# Patient Record
Sex: Female | Born: 1955 | Race: Black or African American | Hispanic: No | Marital: Married | Smoking: Never smoker
Health system: Southern US, Community
[De-identification: ages and names within clinical notes are randomized; demographics above are authoritative.]

## PROBLEM LIST (undated history)

## (undated) DIAGNOSIS — I10 Essential (primary) hypertension: Secondary | ICD-10-CM

---

## 2006-12-19 ENCOUNTER — Emergency Department: Payer: Self-pay | Admitting: Emergency Medicine

## 2011-04-23 ENCOUNTER — Emergency Department: Payer: Self-pay | Admitting: Emergency Medicine

## 2012-01-16 ENCOUNTER — Emergency Department: Payer: Self-pay | Admitting: Emergency Medicine

## 2012-05-15 ENCOUNTER — Emergency Department: Payer: Self-pay | Admitting: Emergency Medicine

## 2012-05-15 LAB — COMPREHENSIVE METABOLIC PANEL
Anion Gap: 3 — ABNORMAL LOW (ref 7–16)
Calcium, Total: 10.9 mg/dL — ABNORMAL HIGH (ref 8.5–10.1)
Chloride: 109 mmol/L — ABNORMAL HIGH (ref 98–107)
Co2: 29 mmol/L (ref 21–32)
Creatinine: 0.72 mg/dL (ref 0.60–1.30)
EGFR (African American): >60
Osmolality: 283 (ref 275–301)
Potassium: 5.1 mmol/L (ref 3.5–5.1)
SGOT(AST): 32 U/L (ref 15–37)
Sodium: 141 mmol/L (ref 136–145)
Total Protein: 8.2 g/dL (ref 6.4–8.2)

## 2012-05-15 LAB — URINALYSIS, COMPLETE
Bilirubin,UR: NEGATIVE
Glucose,UR: NEGATIVE mg/dL (ref 0–75)
Nitrite: NEGATIVE
Ph: 6 (ref 4.5–8.0)
RBC,UR: 2 /HPF (ref 0–5)
Squamous Epithelial: 7
WBC UR: 4 /HPF (ref 0–5)

## 2012-05-15 LAB — CBC
HCT: 46.3 % (ref 35.0–47.0)
HGB: 15.7 g/dL (ref 12.0–16.0)
MCHC: 33.8 g/dL (ref 32.0–36.0)
MCV: 92 fL (ref 80–100)
Platelet: 203 10*3/uL (ref 150–440)
RDW: 13.3 % (ref 11.5–14.5)

## 2012-11-03 ENCOUNTER — Emergency Department: Payer: Self-pay | Admitting: Emergency Medicine

## 2012-11-03 LAB — CBC WITH DIFFERENTIAL/PLATELET
Basophil #: 0 10*3/uL (ref 0.0–0.1)
Basophil %: 0.6 %
Eosinophil #: 0.1 10*3/uL (ref 0.0–0.7)
Eosinophil %: 0.9 %
HCT: 42 % (ref 35.0–47.0)
HGB: 14.3 g/dL (ref 12.0–16.0)
Lymphocyte #: 1.6 10*3/uL (ref 1.0–3.6)
Lymphocyte %: 22 %
MCH: 30.9 pg (ref 26.0–34.0)
MCV: 91 fL (ref 80–100)
Monocyte %: 12.5 %
Neutrophil #: 4.8 10*3/uL (ref 1.4–6.5)
Neutrophil %: 64 %
RDW: 13.9 % (ref 11.5–14.5)
WBC: 7.5 10*3/uL (ref 3.6–11.0)

## 2012-11-03 LAB — BASIC METABOLIC PANEL
Anion Gap: 4 — ABNORMAL LOW (ref 7–16)
BUN: 10 mg/dL (ref 7–18)
Calcium, Total: 8.7 mg/dL (ref 8.5–10.1)
Co2: 26 mmol/L (ref 21–32)
EGFR (African American): >60
EGFR (Non-African Amer.): >60
Glucose: 88 mg/dL (ref 65–99)
Osmolality: 274 (ref 275–301)
Sodium: 138 mmol/L (ref 136–145)

## 2013-11-05 ENCOUNTER — Emergency Department: Payer: Self-pay | Admitting: Emergency Medicine

## 2013-11-05 LAB — PRO B NATRIURETIC PEPTIDE: B-Type Natriuretic Peptide: 25 pg/mL (ref 0–125)

## 2013-11-05 LAB — CBC
HCT: 45.9 % (ref 35.0–47.0)
HGB: 15.2 g/dL (ref 12.0–16.0)
MCH: 30.7 pg (ref 26.0–34.0)
MCHC: 33.1 g/dL (ref 32.0–36.0)
MCV: 93 fL (ref 80–100)
PLATELETS: 213 10*3/uL (ref 150–440)
RBC: 4.95 10*6/uL (ref 3.80–5.20)
RDW: 13.4 % (ref 11.5–14.5)
WBC: 7.4 10*3/uL (ref 3.6–11.0)

## 2013-11-05 LAB — COMPREHENSIVE METABOLIC PANEL
ALBUMIN: 3.6 g/dL (ref 3.4–5.0)
ANION GAP: 4 — AB (ref 7–16)
AST: 25 U/L (ref 15–37)
Alkaline Phosphatase: 65 U/L
BUN: 17 mg/dL (ref 7–18)
Bilirubin,Total: 0.4 mg/dL (ref 0.2–1.0)
CHLORIDE: 107 mmol/L (ref 98–107)
Calcium, Total: 8.7 mg/dL (ref 8.5–10.1)
Co2: 28 mmol/L (ref 21–32)
Creatinine: 0.97 mg/dL (ref 0.60–1.30)
EGFR (African American): >60
Glucose: 103 mg/dL — ABNORMAL HIGH (ref 65–99)
Osmolality: 279 (ref 275–301)
Potassium: 4 mmol/L (ref 3.5–5.1)
SGPT (ALT): 18 U/L (ref 12–78)
Sodium: 139 mmol/L (ref 136–145)
Total Protein: 7.9 g/dL (ref 6.4–8.2)

## 2013-11-05 LAB — URINALYSIS, COMPLETE
Bacteria: NONE SEEN
Bilirubin,UR: NEGATIVE
Blood: NEGATIVE
GLUCOSE, UR: NEGATIVE mg/dL (ref 0–75)
KETONE: NEGATIVE
Leukocyte Esterase: NEGATIVE
NITRITE: NEGATIVE
PH: 5 (ref 4.5–8.0)
Protein: NEGATIVE
RBC, UR: NONE SEEN /HPF (ref 0–5)
Specific Gravity: 1.017 (ref 1.003–1.030)
WBC UR: <1 /HPF (ref 0–5)

## 2013-11-05 LAB — TROPONIN I

## 2014-07-15 ENCOUNTER — Emergency Department: Payer: Self-pay | Admitting: Emergency Medicine

## 2015-04-21 ENCOUNTER — Encounter: Payer: Self-pay | Admitting: *Deleted

## 2015-04-21 ENCOUNTER — Emergency Department
Admission: EM | Admit: 2015-04-21 | Discharge: 2015-04-21 | Disposition: A | Discharge status: Home / Self Care | Payer: TRICARE For Life (TFL) | Attending: Emergency Medicine | Admitting: Emergency Medicine

## 2015-04-21 DIAGNOSIS — R001 Bradycardia, unspecified: Secondary | ICD-10-CM | POA: Diagnosis not present

## 2015-04-21 DIAGNOSIS — I10 Essential (primary) hypertension: Secondary | ICD-10-CM | POA: Diagnosis present

## 2015-04-21 LAB — CBC WITH DIFFERENTIAL/PLATELET
Basophils Absolute: 0.1 10*3/uL (ref 0–0.1)
Basophils Relative: 2 %
EOS ABS: 0.1 10*3/uL (ref 0–0.7)
EOS PCT: 2 %
HCT: 44.3 % (ref 35.0–47.0)
Hemoglobin: 14.5 g/dL (ref 12.0–16.0)
LYMPHS ABS: 1.6 10*3/uL (ref 1.0–3.6)
Lymphocytes Relative: 32 %
MCH: 29.7 pg (ref 26.0–34.0)
MCHC: 32.7 g/dL (ref 32.0–36.0)
MCV: 90.7 fL (ref 80.0–100.0)
MONO ABS: 0.7 10*3/uL (ref 0.2–0.9)
MONOS PCT: 14 %
Neutro Abs: 2.4 10*3/uL (ref 1.4–6.5)
Neutrophils Relative %: 50 %
PLATELETS: 193 10*3/uL (ref 150–440)
RBC: 4.88 MIL/uL (ref 3.80–5.20)
RDW: 13.3 % (ref 11.5–14.5)
WBC: 4.9 10*3/uL (ref 3.6–11.0)

## 2015-04-21 LAB — COMPREHENSIVE METABOLIC PANEL
ALT: 32 U/L (ref 14–54)
AST: 30 U/L (ref 15–41)
Albumin: 3.8 g/dL (ref 3.5–5.0)
Alkaline Phosphatase: 65 U/L (ref 38–126)
Anion gap: 4 — ABNORMAL LOW (ref 5–15)
BUN: 13 mg/dL (ref 6–20)
CHLORIDE: 105 mmol/L (ref 101–111)
CO2: 29 mmol/L (ref 22–32)
CREATININE: 0.83 mg/dL (ref 0.44–1.00)
Calcium: 8.9 mg/dL (ref 8.9–10.3)
GFR calc Af Amer: >60 mL/min (ref >60)
GFR calc non Af Amer: >60 mL/min (ref >60)
GLUCOSE: 119 mg/dL — AB (ref 65–99)
Potassium: 4.3 mmol/L (ref 3.5–5.1)
SODIUM: 138 mmol/L (ref 135–145)
Total Bilirubin: 0.9 mg/dL (ref 0.3–1.2)
Total Protein: 7.4 g/dL (ref 6.5–8.1)

## 2015-04-21 MED ORDER — CLONIDINE HCL 0.1 MG PO TABS: 0.1000 mg | ORAL_TABLET | Freq: Two times a day (BID) | ORAL | Status: AC

## 2015-04-21 MED ORDER — CLONIDINE HCL 0.1 MG PO TABS
0.2000 mg | ORAL_TABLET | Freq: Once | ORAL | Status: AC
  Administered 2015-04-21: 0.2 mg via ORAL
  Filled 2015-04-21: qty 2

## 2015-04-21 MED ORDER — ONDANSETRON 8 MG PO TBDP
8.0000 mg | ORAL_TABLET | Freq: Once | ORAL | Status: AC
  Administered 2015-04-21: 8 mg via ORAL
  Filled 2015-04-21: qty 1

## 2015-04-21 MED ORDER — ONDANSETRON HCL 4 MG PO TABS: 4.0000 mg | ORAL_TABLET | Freq: Every day | ORAL | Status: AC | PRN

## 2015-04-21 NOTE — ED Provider Notes (Signed)
ED ECG REPORT I, QUALE, MARK, the attending physician, personally viewed and interpreted this ECG.  Date: 04/21/2015 EKG Time: 1046 Rate: 50 Rhythm: Sinus bradycardia QRS Axis: normal Intervals: normal corrected QTC is 440 ST/T Wave abnormalities: normal Conduction Disutrbances: none Narrative Interpretation: mild sinus bradycardia without ischemic changes   Sharyn CreamerMark Quale, MD 04/21/15 1058

## 2015-04-21 NOTE — ED Provider Notes (Signed)
[facility] Emergency Department Provider Note  ____________________________________________  Time seen: Approximately 10:07 AM  I have reviewed the triage vital signs and the nursing notes.   HISTORY  Chief Complaint Hypertension    HPI Katie Holder is a 59 y.o. female here for elevated blood pressure. Patient states she had physical at the [place] earlier this week and was told to monitor her blood pressure for 9 days. Patient stated that time a blood pressures systolically was 160 but she does not remember the diastolic. Patient states she's been taking daily blood pressures which continue to be elevated. Patient states she's has a past history hypertension at that she retired from [employer]. Patient stated they tried different medications but all of which is continue secondary to side effects. When questioned the patient's status side effects as mostly nausea. Patient states she's having a temporal headache today on her left side of her head. It is noted that since the patient arrival, blood pressure has steadily increased from 184/82 to a reading of 197/104. Patient denies any vision disturbances or vertigo. Patient does not give a pain and for headache. Described the pain as aching.   History reviewed. No pertinent past medical history.  There are no active problems to display for this patient.   History reviewed. No pertinent past surgical history.  Current Outpatient Rx  Name  Route  Sig  Dispense  Refill  . cloNIDine (CATAPRES) 0.1 MG tablet   Oral   Take 1 tablet (0.1 mg total) by mouth 2 (two) times daily.   60 tablet   0   . ondansetron (ZOFRAN) 4 MG tablet   Oral   Take 1 tablet (4 mg total) by mouth daily as needed for nausea or vomiting.   30 tablet   0     Allergies Aspirin  No family history on file.  Social History Social History  Substance Use Topics  . Smoking status: Never Smoker   . Smokeless tobacco: None  . Alcohol  Use: No    Review of Systems  Constitutional: No fever/chills Eyes: No visual changes. ENT: No sore throat. Cardiovascular: Denies chest pain. Respiratory: Denies shortness of breath. Gastrointestinal: No abdominal pain.  No nausea, no vomiting.  No diarrhea.  No constipation. Genitourinary: Negative for dysuria. Musculoskeletal: Negative for back pain. Skin: Negative for rash. Neurological: Negative for headaches, focal weakness or numbness. Endocrine:Hypertension Allergic/Immunilogical: Aspirin 10-point ROS otherwise negative.  ____________________________________________   PHYSICAL EXAM:  VITAL SIGNS: ED Triage Vitals  Enc Vitals Group     BP 04/21/15 0919 184/82 mmHg     Pulse Rate 04/21/15 0919 66     Resp 04/21/15 0919 20     Temp 04/21/15 0919 98.1 F (36.7 C)     Temp Source 04/21/15 0919 Oral     SpO2 04/21/15 0919 96 %     Weight 04/21/15 0919 218 lb (98.884 kg)     Height 04/21/15 0919  (1.778 m)     Head Cir --      Peak Flow --      Pain Score --      Pain Loc --      Pain Edu? --      Excl. in GC? --     Constitutional: Alert and oriented. Well appearing and in no acute distress. Eyes: Conjunctivae are normal. PERRL. EOMI. Head: Atraumatic. Nose: No congestion/rhinnorhea. Mouth/Throat: Mucous membranes are moist.  Oropharynx non-erythematous. Neck: No stridor.  No cervical spine  tenderness to palpation. Hematological/Lymphatic/Immunilogical: No cervical lymphadenopathy. Cardiovascular: Normal rate, regular rhythm. Grossly normal heart sounds.  Good peripheral circulation. BP peak Respiratory: Normal respiratory effort.  No retractions. Lungs CTAB. Gastrointestinal: Soft and nontender. No distention. No abdominal bruits. No CVA tenderness. Musculoskeletal: No lower extremity tenderness nor edema.  No joint effusions. Neurologic:  Normal speech and language. No gross focal neurologic deficits are appreciated. No gait instability. Skin:  Skin  is warm, dry and intact. No rash noted. Psychiatric: Mood and affect are normal. Speech and behavior are normal.  ____________________________________________   LABS (all labs ordered are listed, but only abnormal results are displayed)  Labs Reviewed  COMPREHENSIVE METABOLIC PANEL - Abnormal; Notable for the following:    Glucose, Bld 119 (*)    Anion gap 4 (*)    All other components within normal limits  CBC WITH DIFFERENTIAL/PLATELET   ____________________________________________  EKG  EKG showed bradycardic at 48 bpm otherwise normal EKG. This EKG was evaluated by  heart station doctor. ____________________________________________  RADIOLOGY   ____________________________________________   PROCEDURES  Procedure(s) performed: None  Critical Care performed: No  ____________________________________________   INITIAL IMPRESSION / ASSESSMENT AND PLAN / ED COURSE  Pertinent labs & imaging results that were available during my care of the patient were reviewed by me and considered in my medical decision making (see chart for details).  Hypertension. Status post 0.2 mg Catapres patient BP is 121/72. Patient will be discharged with a prescription for Catapres 0.1 mg twice a day. Patient advised to follow-up with PCP within 10 days. Return to ER if condition worsens. ____________________________________________   FINAL CLINICAL IMPRESSION(S) / ED DIAGNOSES  Final diagnoses:  Essential hypertension  Bradycardia by electrocardiogram  Bradycardia with 51 - 60 beats per minute      Joni Reiningonald K Jadien Lehigh, PA-C 04/21/15 1159  Sharyn CreamerMark Quale, MD 05/04/15 2324

## 2015-04-21 NOTE — Discharge Instructions (Signed)
Advised patient to avoid beta blocker hypertension medication. Follow-up with PCP for reevaluation and continue care.

## 2015-04-21 NOTE — ED Notes (Signed)
Pt reports hypertension, pt is not currently taking medications, pt denies headache

## 2016-04-30 ENCOUNTER — Emergency Department
Admission: EM | Admit: 2016-04-30 | Discharge: 2016-04-30 | Disposition: A | Discharge status: Home / Self Care | Payer: TRICARE For Life (TFL) | Attending: Emergency Medicine | Admitting: Emergency Medicine

## 2016-04-30 ENCOUNTER — Encounter: Payer: Self-pay | Admitting: Emergency Medicine

## 2016-04-30 DIAGNOSIS — H60392 Other infective otitis externa, left ear: Secondary | ICD-10-CM | POA: Insufficient documentation

## 2016-04-30 DIAGNOSIS — H9202 Otalgia, left ear: Secondary | ICD-10-CM | POA: Diagnosis present

## 2016-04-30 MED ORDER — CIPROFLOXACIN-HYDROCORTISONE 0.2-1 % OT SUSP: 3.0000 [drp] | Freq: Two times a day (BID) | OTIC | 0 refills | Status: AC

## 2016-04-30 NOTE — ED Triage Notes (Signed)
Pt to ed with c/o left earache x 2 days.  Pt denies fever, reports mild cough

## 2016-04-30 NOTE — Discharge Instructions (Signed)
Use the ear drops as directed. Take Tylenol or Motrin as needed for pain relief. Follow-up with Dr. Jenne CampusMcQueen for continued symptoms.

## 2016-04-30 NOTE — ED Provider Notes (Signed)
[facility] Emergency Department Provider Note ____________________________________________  Time seen: 1202  I have reviewed the triage vital signs and the nursing notes.  HISTORY  Chief Complaint  Otalgia  HPI Katie Holder is a 60 y.o. female presents to the ED for evaluationof left earache for the last 2 days. Patient denies any interim fevers, chills, sweats. She denies any ear drainage, tinnitus, vertigo, or hearing loss. She reports tenderness with pressing on the tragus. She has been using hydrogen peroxide cleaning wax in the ears regularly. She denies any known exposure water to the ears.  History reviewed. No pertinent past medical history.  There are no active problems to display for this patient.  History reviewed. No pertinent surgical history.  Prior to Admission medications   Medication Sig Start Date End Date Taking? Authorizing Provider  ciprofloxacin-hydrocortisone (CIPRO HC) otic suspension Place 3 drops into the left ear 2 (two) times daily. Instill in affected ear as directed 04/30/16 05/07/16  Charlesetta IvoryJenise V Bacon Aemon Koeller, PA-C  cloNIDine (CATAPRES) 0.1 MG tablet Take 1 tablet (0.1 mg total) by mouth 2 (two) times daily. 04/21/15 04/20/16  Joni Reiningonald K Smith, PA-C    Allergies Aspirin  History reviewed. No pertinent family history.  Social History Social History  Substance Use Topics  . Smoking status: Never Smoker  . Smokeless tobacco: Never Used  . Alcohol use No    Review of Systems  Constitutional: Negative for fever. Eyes: Negative for visual changes. ENT: Negative for sore throat. Left earache as above. Cardiovascular: Negative for chest pain. Respiratory: Negative for shortness of breath. Neurological: Negative for headaches, focal weakness or numbness. ____________________________________________  PHYSICAL EXAM:  VITAL SIGNS: ED Triage Vitals [04/30/16 1122]  Enc Vitals Group     BP (!) 185/100     Pulse Rate 72   Resp 20     Temp 97.8 F (36.6 C)     Temp Source Oral     SpO2 95 %     Weight 218 lb (98.9 kg)     Height      Head Circumference      Peak Flow      Pain Score 0     Pain Loc      Pain Edu?      Excl. in GC?    Constitutional: Alert and oriented. Well appearing and in no distress. Head: Normocephalic and atraumatic. Eyes: Conjunctivae are normal. PERRL. Normal extraocular movements Ears: Canals clear. TMs intact bilaterally. Left canal with minimal edema noted. Macerated cerumen noted.  Nose: No congestion/rhinorrhea/epistaxis. Mouth/Throat: Mucous membranes are moist. Neck: Supple. No thyromegaly. Hematological/Lymphatic/Immunological: No preauricular lymphadenopathy. Cardiovascular: Normal rate, regular rhythm. Normal distal pulses. Respiratory: Normal respiratory effort. No wheezes/rales/rhonchi. Skin:  Skin is warm, dry and intact. No rash noted. ____________________________________________  INITIAL IMPRESSION / ASSESSMENT AND PLAN / ED COURSE  Patient with an acute left otalgia secondary to an early otitis externa. She'll be discharged with a prescription for Cipro-HC to dose as directed. She will follow-up with [facility] for ongoing symptom management.  Clinical Course    ____________________________________________  FINAL CLINICAL IMPRESSION(S) / ED DIAGNOSES  Final diagnoses:  Other infective acute otitis externa of left ear      Lissa HoardJenise V Bacon Bianco Cange, PA-C 04/30/16 1338    Emily FilbertJonathan E Williams, MD 04/30/16 1343

## 2016-10-10 ENCOUNTER — Emergency Department
Admission: EM | Admit: 2016-10-10 | Discharge: 2016-10-10 | Disposition: A | Discharge status: Home / Self Care | Payer: TRICARE For Life (TFL) | Attending: Emergency Medicine | Admitting: Emergency Medicine

## 2016-10-10 DIAGNOSIS — I10 Essential (primary) hypertension: Secondary | ICD-10-CM | POA: Insufficient documentation

## 2016-10-10 DIAGNOSIS — R42 Dizziness and giddiness: Secondary | ICD-10-CM | POA: Diagnosis present

## 2016-10-10 LAB — CBC
HCT: 44.4 % (ref 35.0–47.0)
Hemoglobin: 15.3 g/dL (ref 12.0–16.0)
MCH: 31 pg (ref 26.0–34.0)
MCHC: 34.4 g/dL (ref 32.0–36.0)
MCV: 90.1 fL (ref 80.0–100.0)
PLATELETS: 213 10*3/uL (ref 150–440)
RBC: 4.93 MIL/uL (ref 3.80–5.20)
RDW: 14 % (ref 11.5–14.5)
WBC: 5.6 10*3/uL (ref 3.6–11.0)

## 2016-10-10 LAB — URINALYSIS, COMPLETE (UACMP) WITH MICROSCOPIC
Bilirubin Urine: NEGATIVE
GLUCOSE, UA: NEGATIVE mg/dL
Hgb urine dipstick: NEGATIVE
Ketones, ur: NEGATIVE mg/dL
NITRITE: NEGATIVE
PH: 5 (ref 5.0–8.0)
Protein, ur: NEGATIVE mg/dL
SPECIFIC GRAVITY, URINE: 1.023 (ref 1.005–1.030)

## 2016-10-10 LAB — BASIC METABOLIC PANEL
Anion gap: 7 (ref 5–15)
BUN: 16 mg/dL (ref 6–20)
CO2: 27 mmol/L (ref 22–32)
CREATININE: 0.87 mg/dL (ref 0.44–1.00)
Calcium: 8.9 mg/dL (ref 8.9–10.3)
Chloride: 102 mmol/L (ref 101–111)
GFR calc Af Amer: >60 mL/min (ref >60)
Glucose, Bld: 94 mg/dL (ref 65–99)
Potassium: 4 mmol/L (ref 3.5–5.1)
SODIUM: 136 mmol/L (ref 135–145)

## 2016-10-10 LAB — GLUCOSE, CAPILLARY: Glucose-Capillary: 105 mg/dL — ABNORMAL HIGH (ref 65–99)

## 2016-10-10 MED ORDER — TRIAMCINOLONE ACETONIDE 0.5 % EX OINT: 1.0000 "application " | TOPICAL_OINTMENT | Freq: Two times a day (BID) | CUTANEOUS | 0 refills | Status: DC

## 2016-10-10 NOTE — ED Notes (Signed)
Pt in hospital volunteering and states that she began to feel "funny". BP taken and was 202/91. Pt denies dizziness or weakness. States "I just feel funny". PT reports feeling same way after gym yesterday. Pt offered wheelchair, denies. Pt sitting in chair awaiting triage.

## 2016-10-10 NOTE — ED Notes (Signed)
Pt resting in bed, resp even and unlabored, denies any pain 

## 2016-10-10 NOTE — ED Triage Notes (Addendum)
Pt "felt funny" PTA. Walking sidewars per her report of what coworkers said. Pt did eat today. Denies weakness or dizziness. Pt alert and oriented X4, active, cooperative, pt in NAD. RR even and unlabored, color WNL.    Pt reports bug bites to bilateral lower legs. She has been applying Benadryl cream to areas.

## 2016-10-10 NOTE — ED Provider Notes (Signed)
[facility] Emergency Department Provider Note       Time seen: ----------------------------------------- 12:13 PM on 10/10/2016 -----------------------------------------     I have reviewed the triage vital signs and the nursing notes.   HISTORY   Chief Complaint Hypertension and Fatigue    HPI Katie Holder is a 61 y.o. female who presents to the ED for feeling funny just prior to arrival. Coworkers had described that she was walking funny but she denies this. Patient states she had an apple fritter his morning and drinks lots of coffee. Patient denies fevers, chills, chest pain, shortness of breath, vomiting or diarrhea. She thinks some of her symptoms are likely secondary to Benadryl cream that she has been applying to her legs for bug bites.   History reviewed. No pertinent past medical history.  There are no active problems to display for this patient.   History reviewed. No pertinent surgical history.  Allergies Aspirin  Social History Social History  Substance Use Topics  . Smoking status: Never Smoker  . Smokeless tobacco: Never Used  . Alcohol use No   Review of Systems Constitutional: Negative for fever. Eyes: Negative for vision changes ENT:  Negative for congestion, sore throat Cardiovascular: Negative for chest pain. Respiratory: Negative for shortness of breath. Gastrointestinal: Negative for abdominal pain, vomiting and diarrhea. Genitourinary: Negative for dysuria. Musculoskeletal: Negative for back pain. Skin:Positive for insect bites to both legs. Neurological: Negative for headaches, focal weakness or numbness.Positive for balance disturbance All systems negative/normal/unremarkable except as stated in the HPI  ____________________________________________   PHYSICAL EXAM:  VITAL SIGNS: ED Triage Vitals [10/10/16 1023]  Enc Vitals Group     BP (!) 167/89     Pulse Rate 69     Resp 18     Temp 99.1 F (37.3  C)     Temp Source Oral     SpO2 100 %     Weight 205 lb (93 kg)     Height 5\' 11"  (1.803 m)     Head Circumference      Peak Flow      Pain Score      Pain Loc      Pain Edu?      Excl. in GC?     Constitutional: Alert and oriented. Well appearing and in no distress. Eyes: Conjunctivae are normal. PERRL. Normal extraocular movements. ENT   Head: Normocephalic and atraumatic.   Nose: No congestion/rhinnorhea.   Mouth/Throat: Mucous membranes are moist.   Neck: No stridor. Cardiovascular: Normal rate, regular rhythm. No murmurs, rubs, or gallops. Respiratory: Normal respiratory effort without tachypnea nor retractions. Breath sounds are clear and equal bilaterally. No wheezes/rales/rhonchi. Gastrointestinal: Soft and nontender. Normal bowel sounds Musculoskeletal: Nontender with normal range of motion in extremities. No lower extremity tenderness nor edema. Neurologic:  Normal speech and language. No gross focal neurologic deficits are appreciated. Strength, sensation, cranial nerves and cerebellar function appear to be normal Skin:  Skin is warm, dry and intact. No rash noted. Psychiatric: Mood and affect are normal. Speech and behavior are normal.  ____________________________________________  EKG: Interpreted by me. Sinus rhythm rate of 66 bpm, normal PR interval, normal QRS size, normal QT. Possible septal infarct age and determinate  ____________________________________________  ED COURSE:  Pertinent labs & imaging results that were available during my care of the patient were reviewed by me and considered in my medical decision making (see chart for details). Patient presents for hypertension and dizziness, we will assess with labs  and imaging as indicated.   Procedures ____________________________________________   LABS (pertinent positives/negatives)  Labs Reviewed  URINALYSIS, COMPLETE (UACMP) WITH MICROSCOPIC - Abnormal; Notable for the following:        Result Value   Color, Urine YELLOW (*)    APPearance HAZY (*)    Leukocytes, UA MODERATE (*)    Bacteria, UA RARE (*)    Squamous Epithelial / LPF 6-30 (*)    All other components within normal limits  GLUCOSE, CAPILLARY - Abnormal; Notable for the following:    Glucose-Capillary 105 (*)    All other components within normal limits  BASIC METABOLIC PANEL  CBC  CBG MONITORING, ED    RADIOLOGY Images were viewed by me  ____________________________________________  FINAL ASSESSMENT AND PLAN  Dizziness  Plan: Patient's labs and imaging were dictated above. Patient had presented for Possible dizziness or general weakness. She has no complaints. Her neurologic exam is completely normal. I have advised outpatient follow-up with her doctor in the next week for blood pressure recheck. She is stable for discharge.   Emily FilbertWilliams, Saralee Bolick E, MD   Note: This note was generated in part or whole with voice recognition software. Voice recognition is usually quite accurate but there are transcription errors that can and very often do occur. I apologize for any typographical errors that were not detected and corrected.     Emily FilbertWilliams, Mayrani Khamis E, MD 10/10/16 [phone]

## 2018-05-29 ENCOUNTER — Emergency Department

## 2018-05-29 ENCOUNTER — Other Ambulatory Visit: Payer: Self-pay

## 2018-05-29 ENCOUNTER — Emergency Department
Admission: EM | Admit: 2018-05-29 | Discharge: 2018-05-29 | Disposition: A | Discharge status: Home / Self Care | Attending: Student in an Organized Health Care Education/Training Program | Admitting: Student in an Organized Health Care Education/Training Program

## 2018-05-29 DIAGNOSIS — M5432 Sciatica, left side: Secondary | ICD-10-CM | POA: Diagnosis not present

## 2018-05-29 DIAGNOSIS — Z79899 Other long term (current) drug therapy: Secondary | ICD-10-CM | POA: Insufficient documentation

## 2018-05-29 DIAGNOSIS — M25552 Pain in left hip: Secondary | ICD-10-CM | POA: Diagnosis present

## 2018-05-29 MED ORDER — KETOROLAC TROMETHAMINE 30 MG/ML IJ SOLN
30.0000 mg | Freq: Once | INTRAMUSCULAR | Status: AC
  Administered 2018-05-29: 30 mg via INTRAMUSCULAR
  Filled 2018-05-29: qty 1

## 2018-05-29 MED ORDER — MELOXICAM 15 MG PO TABS: 15.0000 mg | ORAL_TABLET | Freq: Every day | ORAL | 0 refills | Status: DC

## 2018-05-29 MED ORDER — METHOCARBAMOL 500 MG PO TABS: 500.0000 mg | ORAL_TABLET | Freq: Four times a day (QID) | ORAL | 0 refills | Status: DC

## 2018-05-29 NOTE — ED Notes (Signed)

## 2018-05-29 NOTE — ED Provider Notes (Signed)
[facility] Emergency Department Provider Note  ____________________________________________  Time seen: Approximately 3:42 PM  I have reviewed the triage vital signs and the nursing notes.   HISTORY  Chief Complaint Hip Pain    HPI Katie Holder is a 62 y.o. female who presents the emergency department complaining of hip pain.  Patient reports that she has a burning sensation running down her buttocks into her left hip and left leg.  Patient denies any trauma or injury.  No history of same in the past.  Patient denies any back pain, bowel or bladder dysfunction, saddle anesthesia or paresthesias.  No radicular symptoms past the knee.  Patient has not tried medication for his complaint prior to arrival.  No other complaints at this time.    No past medical history on file.  There are no active problems to display for this patient.   No past surgical history on file.  Prior to Admission medications   Medication Sig Start Date End Date Taking? Authorizing Provider  cloNIDine (CATAPRES) 0.1 MG tablet Take 1 tablet (0.1 mg total) by mouth 2 (two) times daily. 04/21/15 04/20/16  Joni ReiningSmith, Ronald K, PA-C  meloxicam (MOBIC) 15 MG tablet Take 1 tablet (15 mg total) by mouth daily. 05/29/18   Gared Gillie, Delorise RoyalsJonathan D, PA-C  methocarbamol (ROBAXIN) 500 MG tablet Take 1 tablet (500 mg total) by mouth 4 (four) times daily. 05/29/18   Ovide Dusek, Delorise RoyalsJonathan D, PA-C  triamcinolone ointment (KENALOG) 0.5 % Apply 1 application topically 2 (two) times daily. 10/10/16   Emily FilbertWilliams, Hosey Burmester E, MD    Allergies Aspirin  No family history on file.  Social History Social History   Tobacco Use  . Smoking status: Never Smoker  . Smokeless tobacco: Never Used  Substance Use Topics  . Alcohol use: No  . Drug use: No     Review of Systems  Constitutional: No fever/chills Eyes: No visual changes.  Cardiovascular: no chest pain. Respiratory: no cough. No SOB. Gastrointestinal:  No abdominal pain.  No nausea, no vomiting.  No diarrhea.  No constipation. Genitourinary: Negative for dysuria. No hematuria Musculoskeletal: Positive for left buttock/hip pain. Skin: Negative for rash, abrasions, lacerations, ecchymosis. Neurological: Negative for headaches, focal weakness or numbness. 10-point ROS otherwise negative.  ____________________________________________   PHYSICAL EXAM:  VITAL SIGNS: ED Triage Vitals  Enc Vitals Group     BP 05/29/18 1450 (!) 142/91     Pulse Rate 05/29/18 1450 72     Resp 05/29/18 1450 18     Temp 05/29/18 1450 98.2 F (36.8 C)     Temp Source 05/29/18 1450 Oral     SpO2 05/29/18 1450 97 %     Weight 05/29/18 1451 220 lb (99.8 kg)     Height 05/29/18 1451 5\' 10"  (1.778 m)     Head Circumference --      Peak Flow --      Pain Score 05/29/18 1451 10     Pain Loc --      Pain Edu? --      Excl. in GC? --      Constitutional: Alert and oriented. Well appearing and in no acute distress. Eyes: Conjunctivae are normal. PERRL. EOMI. Head: Atraumatic. Neck: No stridor.    Cardiovascular: Normal rate, regular rhythm. Normal S1 and S2.  Good peripheral circulation. Respiratory: Normal respiratory effort without tachypnea or retractions. Lungs CTAB. Good air entry to the bases with no decreased or absent breath sounds. Gastrointestinal: Bowel sounds 4 quadrants. Soft  and nontender to palpation. No guarding or rigidity. No palpable masses. No distention. No CVA tenderness. Musculoskeletal: Full range of motion to all extremities. No gross deformities appreciated.  Visualization of the lumbar spine, left hip is unremarkable.  No tenderness to palpation over the lumbar spine.  No tenderness to palpation over the lateral hip.  Patient is very tender to palpation along the left-sided sciatic notch.  No tenderness over the right-sided sciatic notch.  Negative straight leg raise bilaterally.  Dorsalis pedis pulse intact bilateral lower extremities.   Sensation intact and equal bilateral lower extremities. Neurologic:  Normal speech and language. No gross focal neurologic deficits are appreciated.  Skin:  Skin is warm, dry and intact. No rash noted. Psychiatric: Mood and affect are normal. Speech and behavior are normal. Patient exhibits appropriate insight and judgement.   ____________________________________________   LABS (all labs ordered are listed, but only abnormal results are displayed)  Labs Reviewed - No data to display ____________________________________________  EKG   ____________________________________________  RADIOLOGY I personally viewed and evaluated these images as part of my medical decision making, as well as reviewing the written report by the radiologist.  Dg Hip Unilat W Or Wo Pelvis 2-3 Views Left  Result Date: 05/29/2018 CLINICAL DATA:  Left hip pain radiating to the back and left leg EXAM: DG HIP (WITH OR WITHOUT PELVIS) 2-3V LEFT COMPARISON:  None. FINDINGS: There is very little degenerative joint disease of the hips with minimal superior acetabular spurring present. No acute fracture is seen. The pelvic rami are intact. The SI joints appear corticated. 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 No acute abnormality. Electronically Signed   By: Dwyane DeePaul  Barry M.D.   On: 05/29/2018 15:21    ____________________________________________    PROCEDURES  Procedure(s) performed:    Procedures    Medications - No data to display   ____________________________________________   INITIAL IMPRESSION / ASSESSMENT AND PLAN / ED COURSE  Pertinent labs & imaging results that were available during my care of the patient were reviewed by me and considered in my medical decision making (see chart for details).  Review of the [place] CSRS was performed in accordance of the [facility] prior to dispensing any controlled drugs.      Patient's diagnosis is consistent with sciatica.  Patient  presents emergency department complaining of nontraumatic left buttocks and hip pain.  On exam, findings are most consistent with sciatica.  Negative left hip x-ray.  No indication for further imaging at this time.  Patient will be placed on anti-inflammatory therapy.  Patient is also given a muscle relaxer for at nighttime use.  Follow-up with primary care as needed..Patient is given ED precautions to return to the ED for any worsening or new symptoms.     ____________________________________________  FINAL CLINICAL IMPRESSION(S) / ED DIAGNOSES  Final diagnoses:  Sciatica of left side      NEW MEDICATIONS STARTED DURING THIS VISIT:  ED Discharge Orders         Ordered    meloxicam (MOBIC) 15 MG tablet  Daily     05/29/18 1551    methocarbamol (ROBAXIN) 500 MG tablet  4 times daily     05/29/18 1551              This chart was dictated using voice recognition software/Dragon. Despite best efforts to proofread, errors can occur which can change the meaning. Any change was purely unintentional.    Racheal PatchesCuthriell, Laurens Matheny D, PA-C 05/29/18 1551  Willy Eddy, MD 05/29/18 [phone]

## 2018-05-29 NOTE — ED Triage Notes (Signed)
Pt states that she started having left hip pain dec 25th, states that the pain has cont to get worse and preventing her from moving well pt denies injury, states that she had been on her feet a lot

## 2019-05-26 ENCOUNTER — Ambulatory Visit: Payer: TRICARE For Life (TFL) | Attending: Internal Medicine

## 2019-05-26 DIAGNOSIS — Z20822 Contact with and (suspected) exposure to covid-19: Secondary | ICD-10-CM

## 2019-05-27 LAB — NOVEL CORONAVIRUS, NAA: SARS-CoV-2, NAA: NOT DETECTED

## 2019-05-29 ENCOUNTER — Telehealth: Payer: Self-pay | Admitting: Family Medicine

## 2019-05-29 NOTE — Telephone Encounter (Signed)
Negative COVID results given. Patient results "NOT Detected." Caller expressed understanding. ° °

## 2019-10-17 ENCOUNTER — Other Ambulatory Visit: Payer: Self-pay

## 2019-10-17 ENCOUNTER — Emergency Department: Payer: Medicare Other

## 2019-10-17 ENCOUNTER — Emergency Department
Admission: EM | Admit: 2019-10-17 | Discharge: 2019-10-17 | Disposition: A | Discharge status: Home / Self Care | Payer: Medicare Other | Attending: Emergency Medicine | Admitting: Emergency Medicine

## 2019-10-17 ENCOUNTER — Encounter: Payer: Self-pay | Admitting: Emergency Medicine

## 2019-10-17 DIAGNOSIS — Y929 Unspecified place or not applicable: Secondary | ICD-10-CM | POA: Diagnosis not present

## 2019-10-17 DIAGNOSIS — Y999 Unspecified external cause status: Secondary | ICD-10-CM | POA: Insufficient documentation

## 2019-10-17 DIAGNOSIS — L03113 Cellulitis of right upper limb: Secondary | ICD-10-CM | POA: Diagnosis not present

## 2019-10-17 DIAGNOSIS — Y939 Activity, unspecified: Secondary | ICD-10-CM | POA: Diagnosis not present

## 2019-10-17 DIAGNOSIS — Z79899 Other long term (current) drug therapy: Secondary | ICD-10-CM | POA: Insufficient documentation

## 2019-10-17 DIAGNOSIS — W57XXXA Bitten or stung by nonvenomous insect and other nonvenomous arthropods, initial encounter: Secondary | ICD-10-CM | POA: Insufficient documentation

## 2019-10-17 DIAGNOSIS — S60861A Insect bite (nonvenomous) of right wrist, initial encounter: Secondary | ICD-10-CM | POA: Diagnosis present

## 2019-10-17 LAB — COMPREHENSIVE METABOLIC PANEL
ALT: 17 U/L (ref 0–44)
AST: 23 U/L (ref 15–41)
Albumin: 4.1 g/dL (ref 3.5–5.0)
Alkaline Phosphatase: 58 U/L (ref 38–126)
Anion gap: 9 (ref 5–15)
BUN: 14 mg/dL (ref 8–23)
CO2: 26 mmol/L (ref 22–32)
Calcium: 9.1 mg/dL (ref 8.9–10.3)
Chloride: 104 mmol/L (ref 98–111)
Creatinine, Ser: 0.82 mg/dL (ref 0.44–1.00)
GFR calc Af Amer: >60 mL/min (ref >60)
GFR calc non Af Amer: >60 mL/min (ref >60)
Glucose, Bld: 103 mg/dL — ABNORMAL HIGH (ref 70–99)
Potassium: 4 mmol/L (ref 3.5–5.1)
Sodium: 139 mmol/L (ref 135–145)
Total Bilirubin: 1 mg/dL (ref 0.3–1.2)
Total Protein: 7.6 g/dL (ref 6.5–8.1)

## 2019-10-17 LAB — CBC
HCT: 46.8 % — ABNORMAL HIGH (ref 36.0–46.0)
Hemoglobin: 15.4 g/dL — ABNORMAL HIGH (ref 12.0–15.0)
MCH: 30.3 pg (ref 26.0–34.0)
MCHC: 32.9 g/dL (ref 30.0–36.0)
MCV: 91.9 fL (ref 80.0–100.0)
Platelets: 205 10*3/uL (ref 150–400)
RBC: 5.09 MIL/uL (ref 3.87–5.11)
RDW: 12.5 % (ref 11.5–15.5)
WBC: 5.9 10*3/uL (ref 4.0–10.5)
nRBC: 0 % (ref 0.0–0.2)

## 2019-10-17 MED ORDER — SULFAMETHOXAZOLE-TRIMETHOPRIM 800-160 MG PO TABS
1.0000 | ORAL_TABLET | Freq: Two times a day (BID) | ORAL | 0 refills | Status: DC
Start: 2019-10-17 — End: 2019-12-10

## 2019-10-17 MED ORDER — PREDNISONE 20 MG PO TABS
40.0000 mg | ORAL_TABLET | Freq: Once | ORAL | Status: AC
  Administered 2019-10-17: 40 mg via ORAL
  Filled 2019-10-17: qty 2

## 2019-10-17 MED ORDER — SULFAMETHOXAZOLE-TRIMETHOPRIM 800-160 MG PO TABS
1.0000 | ORAL_TABLET | Freq: Once | ORAL | Status: AC
  Administered 2019-10-17: 1 via ORAL
  Filled 2019-10-17: qty 1

## 2019-10-17 NOTE — ED Triage Notes (Signed)
Pt arrived via POV with reports of suspected insect bite to right posterior lower arm.  Raised area noted. Pt c/o pain.

## 2019-10-17 NOTE — ED Provider Notes (Signed)
[facility] Emergency Department Provider Note ____________________________________________  Time seen: 1410  I have reviewed the triage vital signs and the nursing notes.  HISTORY  Chief Complaint  Insect Bite   HPI Katie Holder is a 64 y.o. female presents to the ER today with complaint of insect bite over her right wrist.  She reports this occurred yesterday.  She reports a burning sensation around the bite with redness and swelling.  She has not noticed any drainage from the area.  She denies numbness, tingling or weakness of the right upper extremity.  She has applied a cool compress but has not taking any medications OTC for this.  History reviewed. No pertinent past medical history.  There are no problems to display for this patient.   History reviewed. No pertinent surgical history.  Prior to Admission medications   Medication Sig Start Date End Date Taking? Authorizing Provider  cloNIDine (CATAPRES) 0.1 MG tablet Take 1 tablet (0.1 mg total) by mouth 2 (two) times daily. 04/21/15 04/20/16  Joni Reining, PA-C  sulfamethoxazole-trimethoprim (BACTRIM DS) 800-160 MG tablet Take 1 tablet by mouth 2 (two) times daily. 10/17/19   Lorre Munroe, NP  triamcinolone ointment (KENALOG) 0.5 % Apply 1 application topically 2 (two) times daily. 10/10/16   Emily Filbert, MD    Allergies Aspirin and Other  No family history on file.  Social History Social History   Tobacco Use  . Smoking status: Never Smoker  . Smokeless tobacco: Never Used  Substance Use Topics  . Alcohol use: No  . Drug use: No    Review of Systems  Constitutional: Negative for fever, chills or body aches. Cardiovascular: Negative for chest pain or chest tightness. Respiratory: Negative for cough or shortness of breath. Skin: Positive for insect bite over right wrist. Neurological: Negative for focal weakness, tingling or  numbness. ____________________________________________  PHYSICAL EXAM:  VITAL SIGNS: ED Triage Vitals  Enc Vitals Group     BP 10/17/19 1348 (!) 163/79     Pulse Rate 10/17/19 1348 65     Resp 10/17/19 1348 16     Temp 10/17/19 1348 98.3 F (36.8 C)     Temp Source 10/17/19 1348 Oral     SpO2 10/17/19 1348 97 %     Weight 10/17/19 1352 213 lb (96.6 kg)     Height 10/17/19 1352 5\' 11"  (1.803 m)     Head Circumference --      Peak Flow --      Pain Score 10/17/19 1352 4     Pain Loc --      Pain Edu? --      Excl. in GC? --     Constitutional: Alert and oriented. Well appearing and in no distress. Cardiovascular: Normal rate, regular rhythm.  Radial pulse 2+ on the right Respiratory: Normal respiratory effort. No wheezes/rales/rhonchi. Musculoskeletal: Normal flexion, extension and rotation of the right wrist. Neurologic:  Normal gait without ataxia. Normal speech and language. No gross focal neurologic deficits are appreciated. Skin:  Pustule noted over the ulnar head. 9 cm x 12 cm area of cellulitis marked. ____________________________________________   LABS  Labs Reviewed  CBC - Abnormal; Notable for the following components:      Result Value   Hemoglobin 15.4 (*)    HCT 46.8 (*)    All other components within normal limits  COMPREHENSIVE METABOLIC PANEL - Abnormal; Notable for the following components:   Glucose, Bld 103 (*)  All other components within normal limits    ____________________________________________   RADIOLOGY   Imaging Orders     DG Wrist Complete Right IMPRESSION:  1. Dorsal ulnar soft tissue swelling. No soft tissue air or  radiopaque foreign body. No other abnormality.    ____________________________________________   INITIAL IMPRESSION / ASSESSMENT AND PLAN / ED COURSE  Infected Insect Bite, Right Wrist with Cellulitis:  CBC, CMET normal Xray right wrist negative for acute findings Septra DS 1 tab PO x 1 Prednisone 40 mg PO  x 1 RX for Septra DS 1 tab PO BID x 10 days ____________________________________________  FINAL CLINICAL IMPRESSION(S) / ED DIAGNOSES  Final diagnoses:  Bug bite with infection, initial encounter  Cellulitis of right wrist      Jearld Fenton, NP 10/17/19 1511    Vanessa [place], MD 10/18/19 [phone]

## 2019-10-17 NOTE — Discharge Instructions (Addendum)
You were seen today for infected insect bite of your right wrist.  Your x-ray was normal.  Your labs were unremarkable.  I am given you a prescription for antibiotics to take twice daily for the next 10 days.  Please take all the medicine as prescribed.  Return to the ER for increased pain, redness, swelling, fever, chills ,nausea or vomiting.

## 2019-10-17 NOTE — ED Notes (Signed)
See triage note  Presents with poss insect bite  Has red area to right lower leg

## 2019-12-10 ENCOUNTER — Other Ambulatory Visit: Payer: Self-pay

## 2019-12-10 ENCOUNTER — Emergency Department
Admission: EM | Admit: 2019-12-10 | Discharge: 2019-12-10 | Disposition: A | Discharge status: Home / Self Care | Payer: Medicare Other | Attending: Emergency Medicine | Admitting: Emergency Medicine

## 2019-12-10 ENCOUNTER — Encounter: Payer: Self-pay | Admitting: Physician Assistant

## 2019-12-10 DIAGNOSIS — S80862A Insect bite (nonvenomous), left lower leg, initial encounter: Secondary | ICD-10-CM | POA: Diagnosis not present

## 2019-12-10 DIAGNOSIS — W57XXXA Bitten or stung by nonvenomous insect and other nonvenomous arthropods, initial encounter: Secondary | ICD-10-CM | POA: Insufficient documentation

## 2019-12-10 DIAGNOSIS — Z7982 Long term (current) use of aspirin: Secondary | ICD-10-CM | POA: Insufficient documentation

## 2019-12-10 DIAGNOSIS — Y929 Unspecified place or not applicable: Secondary | ICD-10-CM | POA: Insufficient documentation

## 2019-12-10 DIAGNOSIS — Y999 Unspecified external cause status: Secondary | ICD-10-CM | POA: Diagnosis not present

## 2019-12-10 DIAGNOSIS — Y939 Activity, unspecified: Secondary | ICD-10-CM | POA: Insufficient documentation

## 2019-12-10 MED ORDER — DEXAMETHASONE SODIUM PHOSPHATE 10 MG/ML IJ SOLN
10.0000 mg | Freq: Once | INTRAMUSCULAR | Status: DC
  Filled 2019-12-10: qty 1

## 2019-12-10 MED ORDER — CEPHALEXIN 500 MG PO CAPS
500.0000 mg | ORAL_CAPSULE | Freq: Three times a day (TID) | ORAL | 0 refills | Status: DC
Start: 2019-12-10 — End: 2020-08-24

## 2019-12-10 MED ORDER — DEXAMETHASONE SODIUM PHOSPHATE 10 MG/ML IJ SOLN
10.0000 mg | Freq: Once | INTRAMUSCULAR | Status: AC
  Administered 2019-12-10: 10 mg via INTRAMUSCULAR

## 2019-12-10 MED ORDER — PREDNISONE 10 MG (21) PO TBPK
ORAL_TABLET | ORAL | 0 refills | Status: DC
Start: 2019-12-10 — End: 2020-08-24

## 2019-12-10 MED ORDER — TRIAMCINOLONE ACETONIDE 0.5 % EX OINT
1.0000 | TOPICAL_OINTMENT | Freq: Two times a day (BID) | CUTANEOUS | 0 refills | Status: DC
Start: 2019-12-10 — End: 2022-11-03

## 2019-12-10 NOTE — ED Triage Notes (Signed)
Pt states she was bitten by something yesterday 24 hours ago, pt states that she is allergic to most insect bites, states that the back of her leg is red and swollen

## 2019-12-10 NOTE — Discharge Instructions (Addendum)
Take over-the-counter Benadryl.  You could also take over-the-counter Claritin and use Benadryl at night. Use the steroid pack starting tomorrow Start taking the Keflex today Return if worsening

## 2019-12-10 NOTE — ED Provider Notes (Signed)
[facility] Emergency Department Provider Note  ____________________________________________   First MD Initiated Contact with Patient 12/10/19 1808     (approximate)  I have reviewed the triage vital signs and the nursing notes.   HISTORY  Chief Complaint Insect Bite    HPI Katie Holder is a 64 y.o. female presents emergency department complaining of bug bites to the lower extremities.  Happened approximately 24 hours ago.  Patient has a large red area but she states she had to have steroids and antibiotic last time.  She denies any fever or chills.  No chest pain or shortness of breath.    History reviewed. No pertinent past medical history.  There are no problems to display for this patient.   History reviewed. No pertinent surgical history.  Prior to Admission medications   Medication Sig Start Date End Date Taking? Authorizing Provider  cephALEXin (KEFLEX) 500 MG capsule Take 1 capsule (500 mg total) by mouth 3 (three) times daily. 12/10/19   Adonai Helzer, Roselyn Bering, PA-C  cloNIDine (CATAPRES) 0.1 MG tablet Take 1 tablet (0.1 mg total) by mouth 2 (two) times daily. 04/21/15 04/20/16  Joni Reining, PA-C  predniSONE (STERAPRED UNI-PAK 21 TAB) 10 MG (21) TBPK tablet Take 6 pills on day one then decrease by 1 pill each day 12/10/19   Faythe Ghee, PA-C  triamcinolone ointment (KENALOG) 0.5 % Apply 1 application topically 2 (two) times daily. 12/10/19   Faythe Ghee, PA-C    Allergies Aspirin and Other  History reviewed. No pertinent family history.  Social History Social History   Tobacco Use   Smoking status: Never Smoker   Smokeless tobacco: Never Used  Building services engineer Use: Never used  Substance Use Topics   Alcohol use: No   Drug use: No    Review of Systems  Constitutional: No fever/chills Eyes: No visual changes. ENT: No sore throat. Respiratory: Denies cough Cardiovascular: Denies chest pain Genitourinary: Negative for  dysuria. Musculoskeletal: Negative for back pain. Skin: Negative for rash.  Positive insect bite Psychiatric: no mood changes,     ____________________________________________   PHYSICAL EXAM:  VITAL SIGNS: ED Triage Vitals  Enc Vitals Group     BP 12/10/19 1754 (!) 176/95     Pulse Rate 12/10/19 1754 72     Resp 12/10/19 1754 16     Temp 12/10/19 1754 99.3 F (37.4 C)     Temp Source 12/10/19 1754 Oral     SpO2 12/10/19 1754 97 %     Weight 12/10/19 1755 235 lb (106.6 kg)     Height 12/10/19 1755 5\' 11"  (1.803 m)     Head Circumference --      Peak Flow --      Pain Score 12/10/19 1755 6     Pain Loc --      Pain Edu? --      Excl. in GC? --     Constitutional: Alert and oriented. Well appearing and in no acute distress. Eyes: Conjunctivae are normal.  Head: Atraumatic. Nose: No congestion/rhinnorhea. Mouth/Throat: Mucous membranes are moist.   Neck:  supple no lymphadenopathy noted Cardiovascular: Normal rate, regular rhythm.  Respiratory: Normal respiratory effort.  No retractions GU: deferred Musculoskeletal: FROM all extremities, warm and well perfused, left inner thigh has a very large red area with bug bite noted in the center, fluctuant questionable blister noted at the edge, several other bites noted on the legs, neurovascular is intact Neurologic:  Normal speech and language.  Skin:  Skin is warm, dry and intact. Psychiatric: Mood and affect are normal. Speech and behavior are normal.  ____________________________________________   LABS (all labs ordered are listed, but only abnormal results are displayed)  Labs Reviewed - No data to display ____________________________________________   ____________________________________________  RADIOLOGY    ____________________________________________   PROCEDURES  Procedure(s) performed: Decadron 10 mg IM  Procedures    ____________________________________________   INITIAL IMPRESSION /  ASSESSMENT AND PLAN / ED COURSE  Pertinent labs & imaging results that were available during my care of the patient were reviewed by me and considered in my medical decision making (see chart for details).   Patient 64 year old female presents emergency department after bug bite.  Physical exam does show a large welt. Plan of care is Decadron 10 mg IM, patient will be discharged with Sterapred and Keflex.  She is also to use the triamcinolone cream as a topical for several days.  Also to take over-the-counter Claritin or Benadryl.  Return if worsening.  States she understands.  Is discharged stable condition.   As part of my medical decision making, I reviewed the following data within the electronic MEDICAL RECORD NUMBER History obtained from family, Nursing notes reviewed and incorporated, Old chart reviewed, Notes from prior ED visits and [place] Controlled Substance Database  ____________________________________________   FINAL CLINICAL IMPRESSION(S) / ED DIAGNOSES  Final diagnoses:  Insect bite of left lower extremity, initial encounter      NEW MEDICATIONS STARTED DURING THIS VISIT:  New Prescriptions   CEPHALEXIN (KEFLEX) 500 MG CAPSULE    Take 1 capsule (500 mg total) by mouth 3 (three) times daily.   PREDNISONE (STERAPRED UNI-PAK 21 TAB) 10 MG (21) TBPK TABLET    Take 6 pills on day one then decrease by 1 pill each day     Note:  This document was prepared using Dragon voice recognition software and may include unintentional dictation errors.    Faythe Ghee, PA-C 12/10/19 Marisue Brooklyn, MD 12/13/19 1254

## 2019-12-10 NOTE — ED Notes (Signed)
Signature pad not working, pt verbalizes understanding of d/c instructions and when to return to ED.

## 2020-08-24 ENCOUNTER — Encounter: Payer: Self-pay | Admitting: Emergency Medicine

## 2020-08-24 ENCOUNTER — Other Ambulatory Visit: Payer: Self-pay

## 2020-08-24 ENCOUNTER — Emergency Department
Admission: EM | Admit: 2020-08-24 | Discharge: 2020-08-24 | Disposition: A | Discharge status: Home / Self Care | Payer: Medicare PPO | Attending: Emergency Medicine | Admitting: Emergency Medicine

## 2020-08-24 DIAGNOSIS — R609 Edema, unspecified: Secondary | ICD-10-CM | POA: Insufficient documentation

## 2020-08-24 DIAGNOSIS — L03116 Cellulitis of left lower limb: Secondary | ICD-10-CM | POA: Diagnosis not present

## 2020-08-24 DIAGNOSIS — B999 Unspecified infectious disease: Secondary | ICD-10-CM | POA: Insufficient documentation

## 2020-08-24 DIAGNOSIS — W57XXXA Bitten or stung by nonvenomous insect and other nonvenomous arthropods, initial encounter: Secondary | ICD-10-CM | POA: Insufficient documentation

## 2020-08-24 DIAGNOSIS — L299 Pruritus, unspecified: Secondary | ICD-10-CM | POA: Diagnosis present

## 2020-08-24 MED ORDER — HYDROXYZINE HCL 25 MG PO TABS: 25.0000 mg | ORAL_TABLET | Freq: Three times a day (TID) | ORAL | 0 refills | Status: DC

## 2020-08-24 MED ORDER — LIDOCAINE 5 % EX PTCH
1.0000 | MEDICATED_PATCH | CUTANEOUS | Status: DC
  Administered 2020-08-24: 1 via TRANSDERMAL
  Filled 2020-08-24: qty 1

## 2020-08-24 MED ORDER — SULFAMETHOXAZOLE-TRIMETHOPRIM 800-160 MG PO TABS
1.0000 | ORAL_TABLET | Freq: Two times a day (BID) | ORAL | 0 refills | Status: DC
Start: 2020-08-24 — End: 2020-09-27

## 2020-08-24 MED ORDER — SULFAMETHOXAZOLE-TRIMETHOPRIM 800-160 MG PO TABS
1.0000 | ORAL_TABLET | Freq: Once | ORAL | Status: AC
  Administered 2020-08-24: 1 via ORAL
  Filled 2020-08-24: qty 1

## 2020-08-24 MED ORDER — HYDROXYZINE HCL 50 MG PO TABS
50.0000 mg | ORAL_TABLET | Freq: Once | ORAL | Status: AC
  Administered 2020-08-24: 50 mg via ORAL
  Filled 2020-08-24: qty 1

## 2020-08-24 NOTE — Discharge Instructions (Signed)
Wear Lidoderm patch for 12 hours.  Follow discharge care instruction take medication as directed.

## 2020-08-24 NOTE — ED Notes (Signed)
See triage note - red swollen area left ankle.

## 2020-08-24 NOTE — ED Triage Notes (Signed)
Presents with possible insect bite to left ankle

## 2020-08-24 NOTE — ED Provider Notes (Signed)
[facility] Emergency Department Provider Note   ____________________________________________   Event Date/Time   First MD Initiated Contact with Patient 08/24/20 1712     (approximate)  I have reviewed the triage vital signs and the nursing notes.   HISTORY  Chief Complaint Insect Bite    HPI Katie Holder is a 64 y.o. female patient presents with insect bite to the left medial ankle.  Incident occurred 2 days ago.  Patient states this itching and mild stinging.  Area has increasing redness of mild edema.  Rates pain as a 4/10.  No palliative measure for complaint.         History reviewed. No pertinent past medical history.  There are no problems to display for this patient.   History reviewed. No pertinent surgical history.  Prior to Admission medications   Medication Sig Start Date End Date Taking? Authorizing Provider  hydrOXYzine (ATARAX/VISTARIL) 25 MG tablet Take 1 tablet (25 mg total) by mouth 3 (three) times daily. 08/24/20  Yes Joni Reining, PA-C  sulfamethoxazole-trimethoprim (BACTRIM DS) 800-160 MG tablet Take 1 tablet by mouth 2 (two) times daily. 08/24/20  Yes Joni Reining, PA-C  cloNIDine (CATAPRES) 0.1 MG tablet Take 1 tablet (0.1 mg total) by mouth 2 (two) times daily. 04/21/15 04/20/16  Joni Reining, PA-C  triamcinolone ointment (KENALOG) 0.5 % Apply 1 application topically 2 (two) times daily. 12/10/19   Faythe Ghee, PA-C    Allergies Aspirin and Other  No family history on file.  Social History Social History   Tobacco Use  . Smoking status: Never Smoker  . Smokeless tobacco: Never Used  Vaping Use  . Vaping Use: Never used  Substance Use Topics  . Alcohol use: No  . Drug use: No    Review of Systems Constitutional: No fever/chills Eyes: No visual changes. ENT: No sore throat. Cardiovascular: Denies chest pain. Respiratory: Denies shortness of breath. Gastrointestinal: No abdominal pain.  No  nausea, no vomiting.  No diarrhea.  No constipation. Genitourinary: Negative for dysuria. Musculoskeletal: Negative for back pain. Skin: Redness and swelling to the left medial ankle.   Neurological: Negative for headaches, focal weakness or numbness. Allergic/Immunilogical: Aspirin ____________________________________________   PHYSICAL EXAM:  VITAL SIGNS: ED Triage Vitals  Enc Vitals Group     BP 08/24/20 1707 (!) 160/89     Pulse Rate 08/24/20 1707 75     Resp 08/24/20 1707 18     Temp 08/24/20 1707 98.5 F (36.9 C)     Temp Source 08/24/20 1707 Oral     SpO2 08/24/20 1707 96 %     Weight 08/24/20 1656 235 lb 0.2 oz (106.6 kg)     Height 08/24/20 1656 5\' 11"  (1.803 m)     Head Circumference --      Peak Flow --      Pain Score 08/24/20 1655 4     Pain Loc --      Pain Edu? --      Excl. in GC? --    Constitutional: Alert and oriented. Well appearing and in no acute distress. Cardiovascular: Normal rate, regular rhythm. Grossly normal heart sounds.  Good peripheral circulation.  Elevated systolic blood pressure. Respiratory: Normal respiratory effort.  No retractions. Lungs CTAB. Musculoskeletal: No lower extremity tenderness nor edema.  No joint effusions. Neurologic:  Normal speech and language. No gross focal neurologic deficits are appreciated. No gait instability. Skin: Maculopapular lesion on erythematous base left medial ankle.  Psychiatric: Mood and affect are normal. Speech and behavior are normal.  ____________________________________________   LABS (all labs ordered are listed, but only abnormal results are displayed)  Labs Reviewed - No data to display ____________________________________________  EKG   ____________________________________________  RADIOLOGY I, Joni Reining, personally viewed and evaluated these images (plain radiographs) as part of my medical decision making, as well as reviewing the written report by the radiologist.  ED MD  interpretation:    Official radiology report(s): No results found.  ____________________________________________   PROCEDURES  Procedure(s) performed (including Critical Care):  Procedures   ____________________________________________   INITIAL IMPRESSION / ASSESSMENT AND PLAN / ED COURSE  As part of my medical decision making, I reviewed the following data within the electronic MEDICAL RECORD NUMBER         Mild cellulitis secondary to insect bite.  Patient given discharge care instruction advised take medication as directed.  Follow-up with PCP as needed.  Return to ED if condition worsens.      ____________________________________________   FINAL CLINICAL IMPRESSION(S) / ED DIAGNOSES  Final diagnoses:  Bug bite with infection, initial encounter     ED Discharge Orders         Ordered    sulfamethoxazole-trimethoprim (BACTRIM DS) 800-160 MG tablet  2 times daily        08/24/20 1722    hydrOXYzine (ATARAX/VISTARIL) 25 MG tablet  3 times daily        08/24/20 1722          *Please note:  Katie Holder was evaluated in Emergency Department on 08/24/2020 for the symptoms described in the history of present illness. She was evaluated in the context of the global COVID-19 pandemic, which necessitated consideration that the patient might be at risk for infection with the SARS-CoV-2 virus that causes COVID-19. Institutional protocols and algorithms that pertain to the evaluation of patients at risk for COVID-19 are in a state of rapid change based on information released by regulatory bodies including the CDC and federal and state organizations. These policies and algorithms were followed during the patient's care in the ED.  Some ED evaluations and interventions may be delayed as a result of limited staffing during and the pandemic.*   Note:  This document was prepared using Dragon voice recognition software and may include unintentional dictation errors.    Joni Reining, PA-C 08/24/20 1727    Minna Antis, MD 08/25/20 2158

## 2020-09-27 ENCOUNTER — Emergency Department
Admission: EM | Admit: 2020-09-27 | Discharge: 2020-09-27 | Disposition: A | Discharge status: Home / Self Care | Payer: Medicare PPO | Attending: Student in an Organized Health Care Education/Training Program | Admitting: Student in an Organized Health Care Education/Training Program

## 2020-09-27 ENCOUNTER — Other Ambulatory Visit: Payer: Self-pay

## 2020-09-27 DIAGNOSIS — W57XXXA Bitten or stung by nonvenomous insect and other nonvenomous arthropods, initial encounter: Secondary | ICD-10-CM | POA: Diagnosis not present

## 2020-09-27 DIAGNOSIS — L089 Local infection of the skin and subcutaneous tissue, unspecified: Secondary | ICD-10-CM | POA: Diagnosis not present

## 2020-09-27 DIAGNOSIS — S40861A Insect bite (nonvenomous) of right upper arm, initial encounter: Secondary | ICD-10-CM | POA: Insufficient documentation

## 2020-09-27 MED ORDER — HYDROXYZINE HCL 25 MG PO TABS: 25.0000 mg | ORAL_TABLET | Freq: Three times a day (TID) | ORAL | 0 refills | Status: DC

## 2020-09-27 MED ORDER — SULFAMETHOXAZOLE-TRIMETHOPRIM 800-160 MG PO TABS
1.0000 | ORAL_TABLET | Freq: Two times a day (BID) | ORAL | 0 refills | Status: AC
Start: 2020-09-27 — End: ?

## 2020-09-27 MED ORDER — HYDROXYZINE HCL 50 MG PO TABS
50.0000 mg | ORAL_TABLET | Freq: Once | ORAL | Status: AC
  Administered 2020-09-27: 50 mg via ORAL
  Filled 2020-09-27: qty 1

## 2020-09-27 NOTE — Discharge Instructions (Addendum)
Follow-up with your primary care provider if any continued problems.  Return to the emergency department if any severe worsening of your symptoms.  A prescription for Atarax for itching was sent to your pharmacy.  Also the antibiotic is there for infection.  You may also apply over-the-counter cortisone cream to the area if additional control for itching is needed.

## 2020-09-27 NOTE — ED Provider Notes (Signed)
K[facility] Provider Note  ____________________________________________   Event Date/Time   First MD Initiated Contact with Patient 09/27/20 1204     (approximate)  I have reviewed the triage vital signs and the nursing notes.   HISTORY  Chief Complaint Insect Bite   HPI Katie Holder is a 65 y.o. female presents to the ED with possible spider bite that happened yesterday.  Patient states that her right arm is swollen and has a severe itch to it.  She denies any difficulty breathing or swallowing.  She states that last month she had similar to her lower extremity which cleared with medication.  This specific time she did not actually see a spider bite her.  Denies any fever or chills.  Her discomfort as a 5 out of 10.         History reviewed. No pertinent past medical history.  There are no problems to display for this patient.   History reviewed. No pertinent surgical history.  Prior to Admission medications   Medication Sig Start Date End Date Taking? Authorizing Provider  cloNIDine (CATAPRES) 0.1 MG tablet Take 1 tablet (0.1 mg total) by mouth 2 (two) times daily. 04/21/15 04/20/16  Joni Reining, PA-C  hydrOXYzine (ATARAX/VISTARIL) 25 MG tablet Take 1 tablet (25 mg total) by mouth 3 (three) times daily. 09/27/20   Tommi Rumps, PA-C  sulfamethoxazole-trimethoprim (BACTRIM DS) 800-160 MG tablet Take 1 tablet by mouth 2 (two) times daily. 09/27/20   Tommi Rumps, PA-C  triamcinolone ointment (KENALOG) 0.5 % Apply 1 application topically 2 (two) times daily. 12/10/19   Faythe Ghee, PA-C    Allergies Aspirin and Other  History reviewed. No pertinent family history.  Social History Social History   Tobacco Use  . Smoking status: Never Smoker  . Smokeless tobacco: Never Used  Vaping Use  . Vaping Use: Never used  Substance Use Topics  . Alcohol use: No  . Drug use: No    Review of  Systems Constitutional: No fever/chills Eyes: No visual changes. ENT: No difficulty swallowing or speaking. Cardiovascular: Denies chest pain. Respiratory: Denies shortness of breath. Gastrointestinal: No abdominal pain.  No nausea, no vomiting.  Genitourinary: Negative for dysuria. Musculoskeletal: Positive right upper extremity swelling. Skin: Positive possible insect bite. Neurological: Negative for headaches, focal weakness or numbness.  ____________________________________________   PHYSICAL EXAM:  VITAL SIGNS: ED Triage Vitals  Enc Vitals Group     BP 09/27/20 1156 (!) 178/86     Pulse Rate 09/27/20 1156 78     Resp 09/27/20 1156 16     Temp 09/27/20 1156 98.9 F (37.2 C)     Temp Source 09/27/20 1156 Oral     SpO2 09/27/20 1156 98 %     Weight 09/27/20 1157 230 lb (104.3 kg)     Height 09/27/20 1157 5\' 11"  (1.803 m)     Head Circumference --      Peak Flow --      Pain Score 09/27/20 1157 5     Pain Loc --      Pain Edu? --      Excl. in GC? --     Constitutional: Alert and oriented. Well appearing and in no acute distress. Eyes: Conjunctivae are normal.  Head: Atraumatic. Neck: No stridor.   Cardiovascular: Normal rate, regular rhythm. Grossly normal heart sounds.  Good peripheral circulation. Respiratory: Normal respiratory effort.  No retractions. Lungs CTAB. Musculoskeletal: She is able to move  upper extremity without any difficulty but because of the rash that she has developed is more comfortable with it bent and has call close wrapped around her arm. Neurologic:  Normal speech and language. No gross focal neurologic deficits are appreciated. No gait instability. Skin:  Skin is warm, dry and intact.  Macular erythematous area to the medial aspect of the right forearm and upper arm.  Area is warm to touch.  No drainage, vesicles or pustules are noted. Psychiatric: Mood and affect are normal. Speech and behavior are  normal.  ____________________________________________   LABS (all labs ordered are listed, but only abnormal results are displayed)  Labs Reviewed - No data to display ____________________________________________  PROCEDURES  Procedure(s) performed (including Critical Care):  Procedures   ____________________________________________   INITIAL IMPRESSION / ASSESSMENT AND PLAN / ED COURSE  As part of my medical decision making, I reviewed the following data within the electronic MEDICAL RECORD NUMBER Notes from prior ED visits and [place] Controlled Substance Database  ----------------------------------------- 1:18 PM on 09/27/2020 ----------------------------------------- No itching at this time and medication seems to be working.  Patient had a similar episode on her left leg 1 month ago and states that the medication that she was given at that time completely cleared the redness and also took care of the itching.  No insect was seen for this particular episode however this could be also an infected bug bite.  Patient is continue with the Atarax as needed for itching a prescription for Bactrim DS was sent to the pharmacy to be taken twice daily for the next 10 days.  She is to return to the emergency [facility] if any severe worsening of her symptoms or difficulty breathing.  Patient was improving and more comfortable prior to discharge.  ____________________________________________   FINAL CLINICAL IMPRESSION(S) / ED DIAGNOSES  Final diagnoses:  Insect bite of right upper extremity with infection, initial encounter     ED Discharge Orders         Ordered    hydrOXYzine (ATARAX/VISTARIL) 25 MG tablet  3 times daily        09/27/20 1321    sulfamethoxazole-trimethoprim (BACTRIM DS) 800-160 MG tablet  2 times daily        09/27/20 1321          *Please note:  [name] was evaluated in Emergency [facility] on 09/27/2020 for the symptoms described in the history of present  illness. She was evaluated in the context of the global COVID-19 pandemic, which necessitated consideration that the patient might be at risk for infection with the SARS-CoV-2 virus that causes COVID-19. Institutional protocols and algorithms that pertain to the evaluation of patients at risk for COVID-19 are in a state of rapid change based on information released by regulatory bodies including the CDC and federal and state organizations. These policies and algorithms were followed during the patient's care in the ED.  Some ED evaluations and interventions may be delayed as a result of limited staffing during and the pandemic.*   Note:  This document was prepared using Dragon voice recognition software and may include unintentional dictation errors.    Tommi Rumps, PA-C 09/27/20 1559    Willy Eddy, MD 09/27/20 [phone]

## 2020-09-27 NOTE — ED Triage Notes (Signed)
Reports she was bitten by probable spider yesterday to right arm; significant swelling increase and burning to right arm. Pt alert and oriented X4, cooperative, RR even and unlabored, color WNL. Pt in NAD.

## 2022-01-03 ENCOUNTER — Other Ambulatory Visit: Payer: Self-pay

## 2022-01-03 ENCOUNTER — Emergency Department
Admission: EM | Admit: 2022-01-03 | Discharge: 2022-01-03 | Disposition: A | Discharge status: Home / Self Care | Payer: Medicare PPO | Attending: Emergency Medicine | Admitting: Emergency Medicine

## 2022-01-03 ENCOUNTER — Encounter: Payer: Self-pay | Admitting: Emergency Medicine

## 2022-01-03 DIAGNOSIS — W57XXXA Bitten or stung by nonvenomous insect and other nonvenomous arthropods, initial encounter: Secondary | ICD-10-CM | POA: Diagnosis not present

## 2022-01-03 DIAGNOSIS — S70361A Insect bite (nonvenomous), right thigh, initial encounter: Secondary | ICD-10-CM | POA: Insufficient documentation

## 2022-01-03 DIAGNOSIS — S80862A Insect bite (nonvenomous), left lower leg, initial encounter: Secondary | ICD-10-CM | POA: Diagnosis not present

## 2022-01-03 DIAGNOSIS — T63441A Toxic effect of venom of bees, accidental (unintentional), initial encounter: Secondary | ICD-10-CM

## 2022-01-03 HISTORY — DX: Essential (primary) hypertension: I10

## 2022-01-03 MED ORDER — CEPHALEXIN 500 MG PO CAPS: 500.0000 mg | ORAL_CAPSULE | Freq: Four times a day (QID) | ORAL | 0 refills | Status: AC

## 2022-01-03 MED ORDER — FAMOTIDINE 20 MG PO TABS
20.0000 mg | ORAL_TABLET | Freq: Once | ORAL | Status: AC
  Administered 2022-01-03: 20 mg via ORAL
  Filled 2022-01-03: qty 1

## 2022-01-03 NOTE — Discharge Instructions (Addendum)
In generalYou may take Benadryl and Pepcid per package instructions to help with your itchiness.  Remember that Benadryl will make you sleepy so is best used at nighttime.  You cannot drive, operate heavy machinery, or perform any tests that require concentration while taking this medication.  Take the Keflex which is an antibiotic.  Return if the redness is getting larger, painful, or if you develop red streaks or a fever.  You may also apply cool compresses to the area.  Please return for any new, worsening, or change in symptoms or other concerns.

## 2022-01-03 NOTE — ED Provider Notes (Signed)
[facility] Provider Note    Event Date/Time   First MD Initiated Contact with Patient 01/03/22 1358     (approximate)   History   Insect Bite   HPI  Katie Holder is a 66 y.o. female who presents today for evaluation of bee sting that occurred yesterday.  Patient reports that a bee stung her on her right inner thigh as well as her left calf.  She reports that the area is very itchy but the redness is also getting slightly larger.  She has not had any pain.  She denies fevers or chills.  She reports that she normally gets a reaction like this anytime she is stung.   There are no problems to display for this patient.         Physical Exam   Triage Vital Signs: ED Triage Vitals  Enc Vitals Group     BP --      Pulse --      Resp --      Temp 01/03/22 1357 98.6 F (37 C)     Temp Source 01/03/22 1357 Oral     SpO2 --      Weight 01/03/22 1331 230 lb (104.3 kg)     Height 01/03/22 1331 5\' 11"  (1.803 m)     Head Circumference --      Peak Flow --      Pain Score 01/03/22 1331 0     Pain Loc --      Pain Edu? --      Excl. in GC? --     Most recent vital signs: Vitals:   01/03/22 1357  BP: (!) 159/90  Resp: 18  Temp: 98.6 F (37 C)  SpO2: 95%    Physical Exam Vitals and nursing note reviewed.  Constitutional:      General: Awake and alert. No acute distress.    Appearance: Normal appearance. The patient is overweight.  HENT:     Head: Normocephalic and atraumatic.     Mouth: Mucous membranes are moist.  No tongue or lip swelling Eyes:     General: PERRL. Normal EOMs        Right eye: No discharge.        Left eye: No discharge.     Conjunctiva/sclera: Conjunctivae normal.  Cardiovascular:     Rate and Rhythm: Normal rate and regular rhythm.     Pulses: Normal pulses.     Heart sounds: Normal heart sounds Pulmonary:     Effort: Pulmonary effort is normal. No respiratory distress.     Breath sounds: Normal breath sounds.  No  wheezes Abdominal:     Abdomen is soft. There is no abdominal tenderness. No rebound or guarding. No distention. Musculoskeletal:        General: No swelling. Normal range of motion.     Cervical back: Normal range of motion and neck supple.  Skin:    General: Skin is warm and dry.     Capillary Refill: Capillary refill takes less than 2 seconds.     Findings: Right upper thigh: 10 x 10 cm area of erythema without open wounds.  More proximal portion is mildly indurated.  No crepitus.  No circumferential swelling.  No visible stinger.  No fluctuance.  No lymphangitis.  Left calf with 7 x 7 cm area of erythema without open wounds.  No induration.  No lymphangitis.  No crepitus.  No circumferential swelling. Normal distal pulses bilaterally.  No  involvement of joints Neurological:     Mental Status: The patient is awake and alert.      ED Results / Procedures / Treatments   Labs (all labs ordered are listed, but only abnormal results are displayed) Labs Reviewed - No data to display   EKG     RADIOLOGY     PROCEDURES:  Critical Care performed:   Procedures   MEDICATIONS ORDERED IN ED: Medications  famotidine (PEPCID) tablet 20 mg (20 mg Oral Given 01/03/22 1416)     IMPRESSION / MDM / ASSESSMENT AND PLAN / ED COURSE  I reviewed the triage vital signs and the nursing notes.   Differential diagnosis includes, but is not limited to, localized reaction versus cellulitis.  Patient is awake and alert, hemodynamically stable and afebrile.  The itchiness is most consistent with a localized reaction.  She has no history of anaphylaxis.  No tongue or lip swelling, shortness of breath, wheezing, hemodynamic instability, full body rash, nausea, vomiting, diarrhea to suggest anaphylaxis.  She reports that she has had a similar reaction multiple times in the past.  Her right upper thigh has an area of induration.  For this reason, will prescribe a short course of Keflex.  She did not  wish to have any blood work or testing done.  She reports that she has to leave and wants medications only.  She was instructed to return if the red area gets larger, if she develops red streaks, fever, or any other changes or concerns.  She was instructed to follow-up with her outpatient provider in 2 days for wound recheck.  We discussed tricked return precautions.  Patient understands and agrees with plan.  Discharged in stable condition.   Patient's presentation is most consistent with acute, uncomplicated illness.      FINAL CLINICAL IMPRESSION(S) / ED DIAGNOSES   Final diagnoses:  Bee sting reaction, accidental or unintentional, initial encounter     Rx / DC Orders   ED Discharge Orders          Ordered    cephALEXin (KEFLEX) 500 MG capsule  4 times daily        01/03/22 1415             Note:  This document was prepared using Dragon voice recognition software and may include unintentional dictation errors.   Keturah Shavers 01/03/22 1425    Sharman Cheek, MD 01/03/22 [phone]

## 2022-01-03 NOTE — ED Triage Notes (Signed)
Patient to ED via POV for a bee sting that occurred yesterday afternoon on left calf and right inner thigh. Swelling noted per patient.

## 2022-04-04 IMAGING — DX DG WRIST COMPLETE 3+V*R*
4 series · 4 of 4 positions shown · non-contrast
Comparison: None.

CLINICAL DATA: Insect bite with cellulitis per provider. Patient
states that she was working in the garden yesterday and thinks that
she must have gotten bit. Patient with redness and swelling as well
as a burning sensation to posterior distal forearm towards ulnar
aspect.

EXAM:
RIGHT WRIST - COMPLETE 3+ VIEW

[wrist ap (1 of 2)]
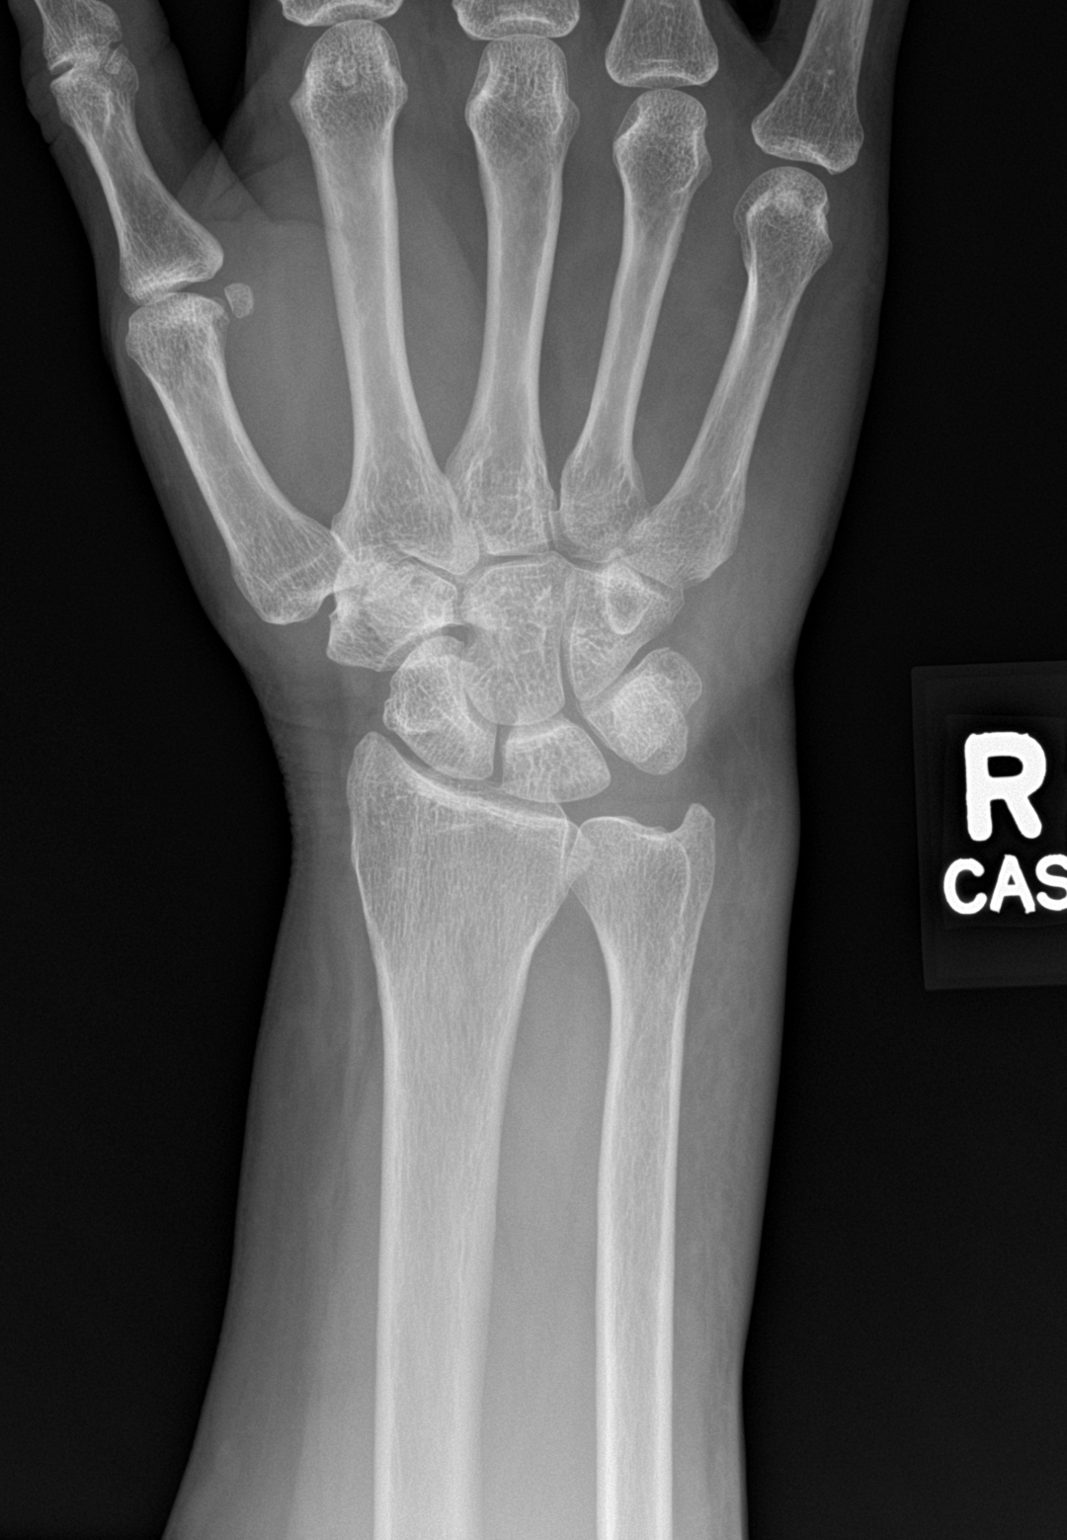

[wrist obl]
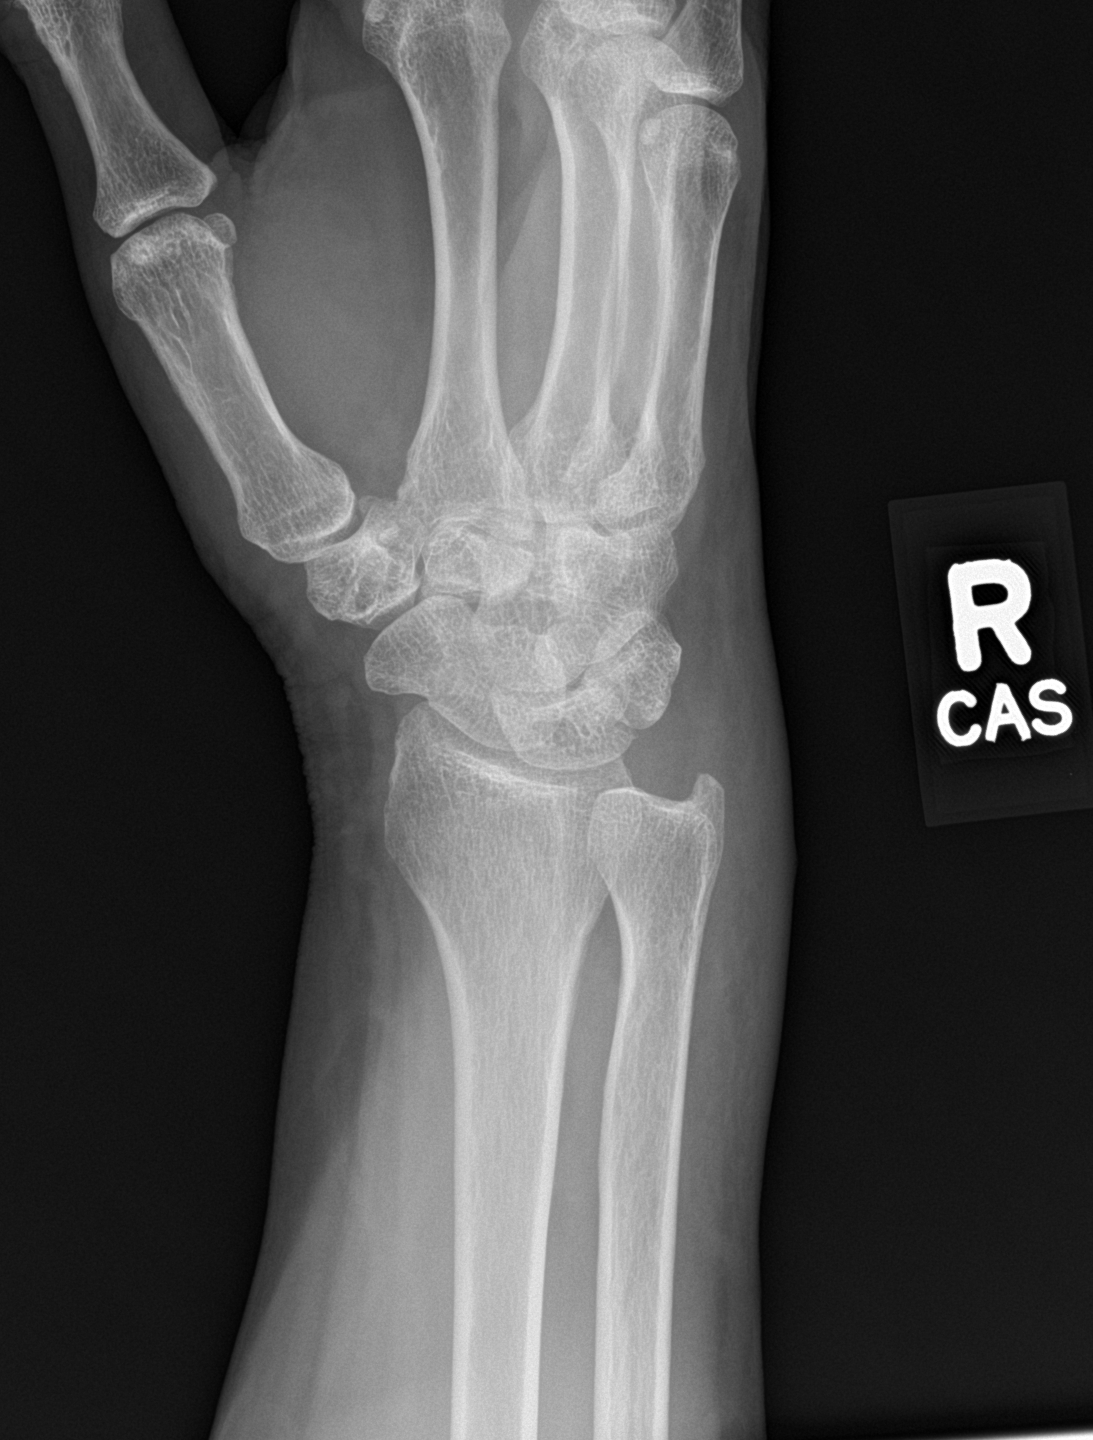

[wrist lat]
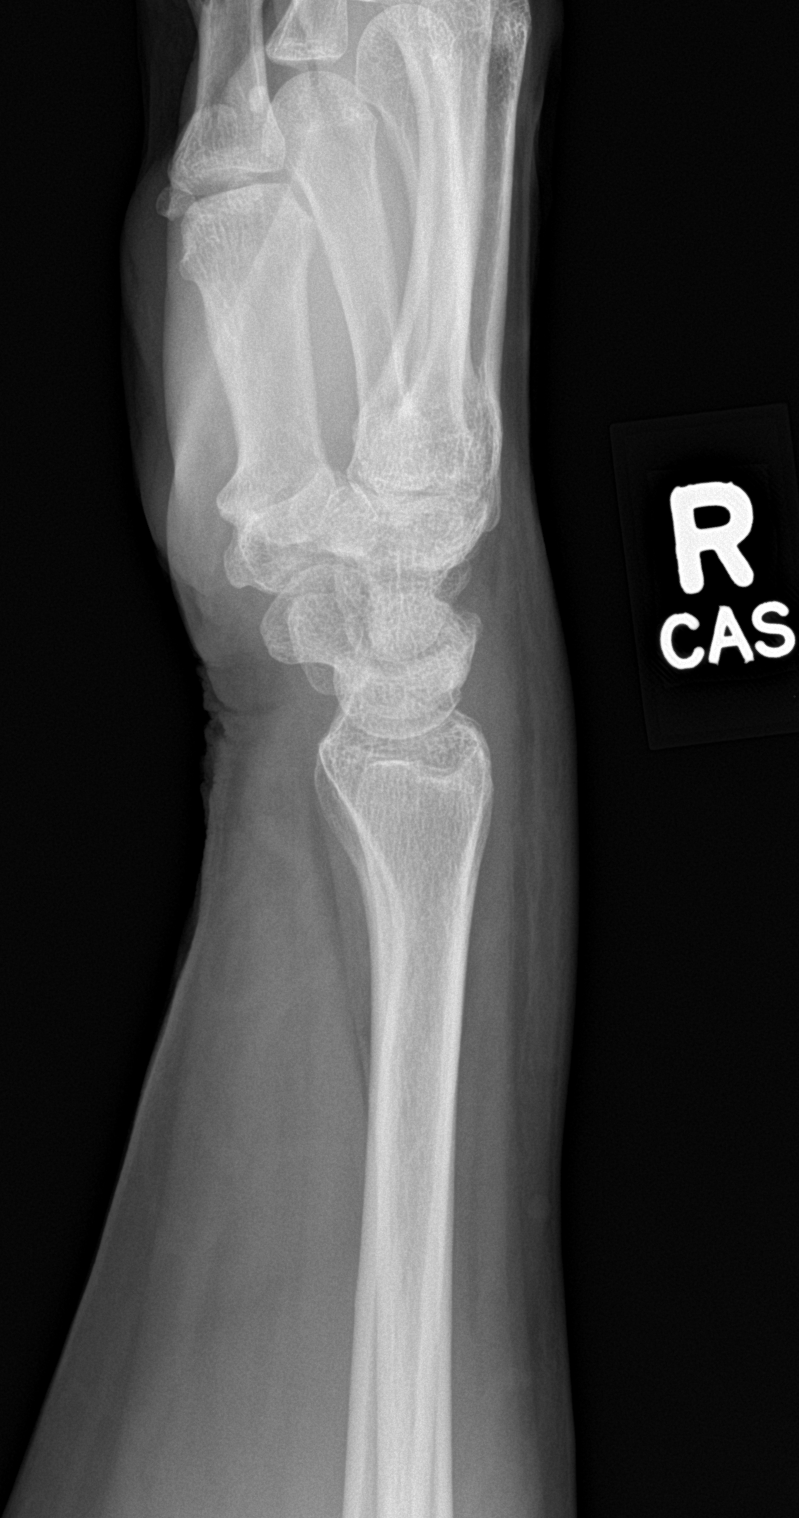

[wrist ap (2 of 2)]
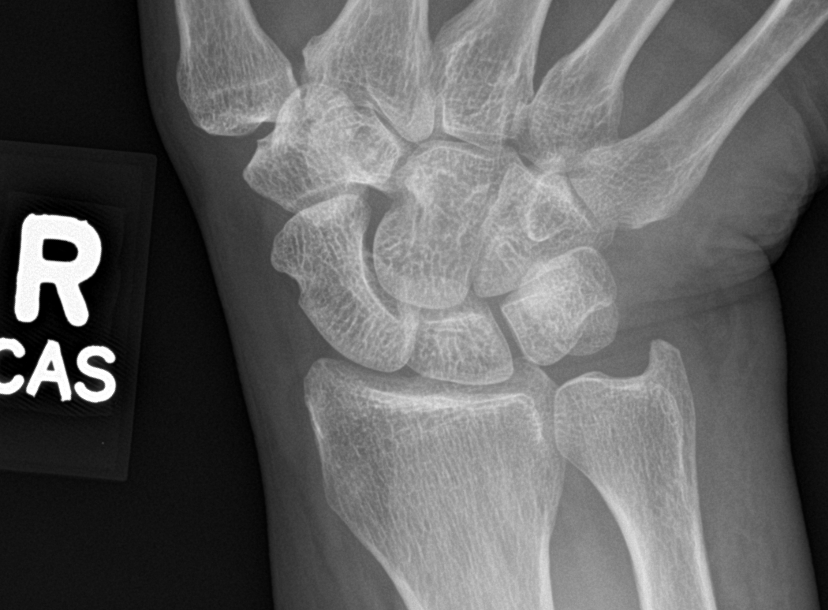

[4 of 4 positions shown; findings below may reference images not displayed]

FINDINGS: No fracture or bone lesion.

Joints are normally spaced and aligned.

There is dorsal ulnar soft tissue swelling. No radiopaque foreign
body. No soft tissue air.
IMPRESSION: 1. Dorsal ulnar soft tissue swelling. No soft tissue air or
radiopaque foreign body. No other abnormality.

## 2022-11-03 ENCOUNTER — Emergency Department
Admission: EM | Admit: 2022-11-03 | Discharge: 2022-11-03 | Disposition: A | Discharge status: Home / Self Care | Payer: Medicare PPO | Attending: Emergency Medicine | Admitting: Emergency Medicine

## 2022-11-03 ENCOUNTER — Other Ambulatory Visit: Payer: Self-pay

## 2022-11-03 DIAGNOSIS — W57XXXA Bitten or stung by nonvenomous insect and other nonvenomous arthropods, initial encounter: Secondary | ICD-10-CM | POA: Insufficient documentation

## 2022-11-03 DIAGNOSIS — S80861A Insect bite (nonvenomous), right lower leg, initial encounter: Secondary | ICD-10-CM | POA: Insufficient documentation

## 2022-11-03 MED ORDER — CEPHALEXIN 500 MG PO CAPS: 500.0000 mg | ORAL_CAPSULE | Freq: Four times a day (QID) | ORAL | 0 refills | Status: AC

## 2022-11-03 MED ORDER — TRIAMCINOLONE ACETONIDE 0.5 % EX OINT: 1.0000 | TOPICAL_OINTMENT | Freq: Two times a day (BID) | CUTANEOUS | 0 refills | Status: AC

## 2022-11-03 MED ORDER — HYDROXYZINE HCL 25 MG PO TABS: 25.0000 mg | ORAL_TABLET | Freq: Four times a day (QID) | ORAL | 1 refills | Status: AC | PRN

## 2022-11-03 NOTE — ED Provider Notes (Signed)
[facility] Provider Note    Event Date/Time   First MD Initiated Contact with Patient 11/03/22 1959     (approximate)   History   Insect Bite   HPI  Katie Holder is a 67 y.o. female with PMH of HTN who presents for evaluation of insect bites.  She noticed multiple bites on her shins yesterday and they have become increasingly itchy today.  She has been treated for this multiple times in the past years by this ED.  She tried to take care of the bites herself at home by applying a topical Benadryl cream which she says usually works but has not been successful this time.         Physical Exam   Triage Vital Signs: ED Triage Vitals  Enc Vitals Group     BP 11/03/22 1753 (!) 175/68     Pulse Rate 11/03/22 1753 71     Resp 11/03/22 1753 18     Temp 11/03/22 1753 98.3 F (36.8 C)     Temp Source 11/03/22 1753 Oral     SpO2 11/03/22 1753 95 %     Weight --      Height 11/03/22 1756 5\' 11"  (1.803 m)     Head Circumference --      Peak Flow --      Pain Score 11/03/22 1756 0     Pain Loc --      Pain Edu? --      Excl. in GC? --     Most recent vital signs: Vitals:   11/03/22 1753  BP: (!) 175/68  Pulse: 71  Resp: 18  Temp: 98.3 F (36.8 C)  SpO2: 95%     General: Awake, no distress.  CV:  Good peripheral perfusion.  Resp:  Normal effort.  Abd:  No distention.  Other:  Skin is warm dry and intact.  Multiple sites on the anterior shins of erythematous macules.  No drainage, vesicles or pustules are noted area is warm to touch.   ED Results / Procedures / Treatments   Labs (all labs ordered are listed, but only abnormal results are displayed) Labs Reviewed - No data to display   PROCEDURES:  Critical Care performed: No  Procedures   MEDICATIONS ORDERED IN ED: Medications - No data to display   IMPRESSION / MDM / ASSESSMENT AND PLAN / ED COURSE  I reviewed the triage vital signs and the nursing notes.                               Differential diagnosis includes, but is not limited to, insect bite, cellulitis, contact dermatitis.  Patient's presentation is most consistent with acute, uncomplicated illness.  Presented to the ED for evaluation of multiple insect bites. On exam there are multiple sites of erythematous macules.  Believe patient has a mild cellulitis secondary to insect bite.  I will prescribe her a course of oral antibiotics as well as a topical steroid cream to help with the itching.  Patient may also take allergy medicine to alleviate symptoms.  She can follow-up with her PCP as needed and may return to the ED if her condition worsens.  Patient verbalized understanding, all questions were answered, patient stable for discharge.     FINAL CLINICAL IMPRESSION(S) / ED DIAGNOSES   Final diagnoses:  Insect bite of lower leg, unspecified laterality, initial encounter     Rx /  DC Orders   ED Discharge Orders          Ordered    triamcinolone ointment (KENALOG) 0.5 %  2 times daily        11/03/22 2038    hydrOXYzine (ATARAX) 25 MG tablet  Every 6 hours PRN        11/03/22 2038    cephALEXin (KEFLEX) 500 MG capsule  4 times daily        11/03/22 2038             Note:  This document was prepared using Dragon voice recognition software and may include unintentional dictation errors.   Cameron Ali, PA-C 11/03/22 2039    Merwyn Katos, MD 11/03/22 2240

## 2022-11-03 NOTE — ED Triage Notes (Signed)
Pt to ed from home for insect bites to lower right leg. Pt advised "I come here yearly for the same". Pt has several insect bites to the leg. Pt is caox4, in no acute distress and ambulatory in triage. Pt has been using benadryl cream with no relief.

## 2024-02-05 ENCOUNTER — Emergency Department

## 2024-02-05 ENCOUNTER — Other Ambulatory Visit: Payer: Self-pay

## 2024-02-05 ENCOUNTER — Emergency Department
Admission: EM | Admit: 2024-02-05 | Discharge: 2024-02-05 | Disposition: A | Discharge status: Home / Self Care | Attending: Emergency Medicine | Admitting: Emergency Medicine

## 2024-02-05 DIAGNOSIS — M5030 Other cervical disc degeneration, unspecified cervical region: Secondary | ICD-10-CM | POA: Diagnosis not present

## 2024-02-05 DIAGNOSIS — M47812 Spondylosis without myelopathy or radiculopathy, cervical region: Secondary | ICD-10-CM | POA: Insufficient documentation

## 2024-02-05 DIAGNOSIS — M62838 Other muscle spasm: Secondary | ICD-10-CM | POA: Diagnosis not present

## 2024-02-05 DIAGNOSIS — M542 Cervicalgia: Secondary | ICD-10-CM | POA: Diagnosis present

## 2024-02-05 MED ORDER — PREDNISONE 10 MG PO TABS: 10.0000 mg | ORAL_TABLET | Freq: Every day | ORAL | 0 refills | Status: AC

## 2024-02-05 MED ORDER — KETOROLAC TROMETHAMINE 15 MG/ML IJ SOLN
15.0000 mg | Freq: Once | INTRAMUSCULAR | Status: AC
  Administered 2024-02-05: 15 mg via INTRAMUSCULAR
  Filled 2024-02-05: qty 1

## 2024-02-05 NOTE — Discharge Instructions (Signed)
 If your neck pain persist tomorrow, start prednisone  taper.  You may take Tylenol in addition to prednisone  for additional pain relief.  You may try warm compresses along your neck to help with pain and stiffness.  Follow-up with orthopedics, spine specialist, primary care provider in 1 week if no improvement of symptoms.  Return to the ER for any fevers, increasing pain weakness numbness tingling or any urgent changes in her health

## 2024-02-05 NOTE — ED Triage Notes (Signed)
 Patient states stiffened neck that started earlier today

## 2024-02-05 NOTE — ED Provider Notes (Signed)
 [facility] Provider Note   CSN: [id number] Arrival date & time: 02/05/24  1843     Patient presents with: Torticollis   Katie Holder is a 68 y.o. female presents to the emergency department for evaluation of neck pain.  Earlier today she developed some stiffness and soreness as well as tightness in her neck.  She denies any numbness tingling or radicular symptoms.  No fevers.  No trauma or injury.  She denies any dizziness, lightheadedness, headaches, facial pain.  She has not any medications for her symptoms.  Pain is moderate she has a hard time bending and twisting her neck.  She has some slight muscle tension and tightness along the shoulders.  No history of this in the past   HPI     Prior to Admission medications   Medication Sig Start Date End Date Taking? Authorizing Provider  predniSONE  (DELTASONE ) 10 MG tablet Take 1 tablet (10 mg total) by mouth daily. 6,5,4,3,2,1 six day taper 02/05/24  Yes Charlene Debby BROCKS, PA-C  cloNIDine  (CATAPRES ) 0.1 MG tablet Take 1 tablet (0.1 mg total) by mouth 2 (two) times daily. 04/21/15 04/20/16  Claudene Tanda POUR, PA-C  sulfamethoxazole -trimethoprim  (BACTRIM  DS) 800-160 MG tablet Take 1 tablet by mouth 2 (two) times daily. 09/27/20   Summers, Rhonda L, PA-C  triamcinolone  ointment (KENALOG ) 0.5 % Apply 1 Application topically 2 (two) times daily. 11/03/22   Cleaster Tinnie LABOR, PA-C    Allergies: Aspirin and Other    Review of Systems  Updated Vital Signs BP (!) 175/117   Pulse 95   Temp 98.3 F (36.8 C) (Oral)   Ht 5' 11 (1.803 m)   Wt 108.9 kg   SpO2 95%   BMI 33.47 kg/m   Physical Exam Constitutional:      Appearance: She is well-developed.  HENT:     Head: Normocephalic and atraumatic.  Eyes:     Conjunctiva/sclera: Conjunctivae normal.  Cardiovascular:     Rate and Rhythm: Normal rate.  Pulmonary:     Effort: Pulmonary effort is normal. No respiratory distress.  Musculoskeletal:         General: Normal range of motion.     Cervical back: Normal range of motion.     Comments: No cervical spinous process tenderness.  Mild left and right paravertebral muscle tenderness.  She has limited flexion extension left and right rotation of the cervical spine.  Negative Spurling's test bilaterally.  She has normal strength in the upper extremities.  No neurological deficits in upper extremities  Skin:    General: Skin is warm.     Capillary Refill: Capillary refill takes less than 2 seconds.     Findings: No rash.  Neurological:     General: No focal deficit present.     Mental Status: She is alert and oriented to person, place, and time. Mental status is at baseline.     Cranial Nerves: No cranial nerve deficit.     Motor: No weakness.     Coordination: Coordination normal.     Gait: Gait normal.  Psychiatric:        Mood and Affect: Mood normal.        Behavior: Behavior normal.        Thought Content: Thought content normal.     (all labs ordered are listed, but only abnormal results are displayed) Labs Reviewed - No data to display  EKG: None  Radiology: DG Cervical Spine 2-3 Views Result Date:  02/05/2024 CLINICAL DATA:  neck pain EXAM: CERVICAL SPINE - 2-3 VIEW COMPARISON:  None Available. FINDINGS: Limited evaluation due to overlapping osseous structures and overlying soft tissues. There is no evidence of cervical spine fracture or prevertebral soft tissue swelling. Multilevel moderate degenerative changes the spine. Alignment is normal. No other significant bone abnormalities are identified. IMPRESSION: No acute displaced fracture or traumatic listhesis of the cervical spine. Limited evaluation due to overlapping osseous structures and overlying soft tissues. Electronically Signed   By: Morgane  Naveau M.D.   On: 02/05/2024 22:05     Procedures   Medications Ordered in the ED  ketorolac  (TORADOL ) 15 MG/ML injection 15 mg (15 mg Intramuscular Given 02/05/24 2226)                                     Medical Decision Making Amount and/or Complexity of Data Reviewed Radiology: ordered.  Risk Prescription drug management.   68 year old female with neck pain, stiffness.  Has advanced degenerative changes of the cervical spine.  Developed some stiffness today but has no neurological deficits on exam.  Vital signs are stable, afebrile.  X-rays show multilevel degenerative changes with spondylosis and degenerative disc disease but no significant spondylolisthesis and no evidence of acute bony abnormality.  She is given Toradol  15 mg IM.  Has tolerated this well in the past.  She is sent home with a prednisone  taper for 6 days to start tomorrow if her symptoms persist tomorrow.  She is encouraged to follow-up with spine specialist, orthopedic specialist or primary care provider in 1 week for recheck if symptoms persist.  She is given strict return precautions understands signs symptoms return to ER for. Final diagnoses:  DDD (degenerative disc disease), cervical  Cervical spondylosis  Cervical paraspinal muscle spasm    ED Discharge Orders          Ordered    predniSONE  (DELTASONE ) 10 MG tablet  Daily        02/05/24 2226               Charlene Debby JAYSON DEVONNA 02/05/24 2231    Claudene Rover, MD 02/05/24 2330
